# Patient Record
Sex: Female | Born: 1990 | Race: Black or African American | Hispanic: No | Marital: Single | Smoking: Current every day smoker
Health system: Southern US, Community
[De-identification: ages and names within clinical notes are randomized; demographics above are authoritative.]

## PROBLEM LIST (undated history)

## (undated) DIAGNOSIS — Z789 Other specified health status: Secondary | ICD-10-CM

## (undated) HISTORY — PX: NO PAST SURGERIES: SHX2092

---

## 2018-11-29 ENCOUNTER — Emergency Department (HOSPITAL_COMMUNITY)
Admission: EM | Admit: 2018-11-29 | Discharge: 2018-11-29 | Disposition: A | Discharge status: Home / Self Care | Payer: Self-pay | Attending: Emergency Medicine | Admitting: Emergency Medicine

## 2018-11-29 ENCOUNTER — Other Ambulatory Visit: Payer: Self-pay

## 2018-11-29 ENCOUNTER — Encounter (HOSPITAL_COMMUNITY): Payer: Self-pay

## 2018-11-29 DIAGNOSIS — N939 Abnormal uterine and vaginal bleeding, unspecified: Secondary | ICD-10-CM | POA: Insufficient documentation

## 2018-11-29 DIAGNOSIS — Z3202 Encounter for pregnancy test, result negative: Secondary | ICD-10-CM | POA: Insufficient documentation

## 2018-11-29 LAB — POC URINE PREG, ED: Preg Test, Ur: NEGATIVE

## 2018-11-29 NOTE — ED Triage Notes (Signed)
Patient missed two monthly periods, thought she may be pregnant and then started some vaginal bleeding today.  Patient's brother died two days ago

## 2018-11-29 NOTE — ED Notes (Signed)
Pt ambulated to the restroom with steady gait. 

## 2018-11-29 NOTE — ED Notes (Signed)
Patient Alert and oriented to baseline. Stable and ambulatory to baseline. Patient verbalized understanding of the discharge instructions.  Patient belongings were taken by the patient.   

## 2018-11-29 NOTE — Discharge Instructions (Addendum)
Recheck at [facility] if periods do not return to normal

## 2018-11-29 NOTE — ED Notes (Signed)
Urine culture collected and sent to the main lab. 

## 2018-11-29 NOTE — ED Provider Notes (Signed)
[facility] Provider Note   CSN: [id number] Arrival date & time: 11/29/18  1112     History   Chief Complaint Chief Complaint  Patient presents with  . Vaginal Bleeding    HPI Lynn Davis is a 28 y.o. female.     The history is provided by the patient. No language interpreter was used.  Vaginal Bleeding Quality:  Bright red Severity:  Moderate Onset quality:  Gradual Timing:  Constant Progression:  Worsening Chronicity:  New Possible pregnancy: yes   Relieved by:  None tried Worsened by:  Nothing Ineffective treatments:  None tried Associated symptoms: no abdominal pain   Risk factors: unprotected sex   Pt missed a period last month and she is concerned that she could be pregnant  History reviewed. No pertinent past medical history.  There are no active problems to display for this patient.   History reviewed. No pertinent surgical history.   OB History   No obstetric history on file.      Home Medications    Prior to Admission medications   Not on File    Family History History reviewed. No pertinent family history.  Social History Social History   Tobacco Use  . Smoking status: Never Smoker  Substance Use Topics  . Alcohol use: Yes    Alcohol/week: 7.0 standard drinks    Types: 7 Glasses of wine per week  . Drug use: Yes    Frequency: 3.0 times per week    Types: Marijuana     Allergies   Patient has no known allergies.   Review of Systems Review of Systems  Gastrointestinal: Negative for abdominal pain.  Genitourinary: Positive for vaginal bleeding.  All other systems reviewed and are negative.    Physical Exam Updated Vital Signs BP 117/88 (BP Location: Right Arm)   Pulse 78   Temp 98.3 F (36.8 C) (Oral)   Resp 17   Ht 5\' 2"  (1.575 m)   Wt 59 kg   LMP 09/30/2018 (Exact Date)   SpO2 100%   BMI 23.78 kg/m   Physical Exam Vitals signs and nursing note reviewed.   Constitutional:      Appearance: She is well-developed.  HENT:     Head: Normocephalic.  Neck:     Musculoskeletal: Normal range of motion.  Cardiovascular:     Rate and Rhythm: Normal rate.  Pulmonary:     Effort: Pulmonary effort is normal.  Abdominal:     General: Abdomen is flat. There is no distension.  Musculoskeletal: Normal range of motion.  Skin:    General: Skin is warm.  Neurological:     General: No focal deficit present.     Mental Status: She is alert and oriented to person, place, and time.  Psychiatric:        Mood and Affect: Mood normal.      ED Treatments / Results  Labs (all labs ordered are listed, but only abnormal results are displayed) Labs Reviewed  POC URINE PREG, ED    EKG None  Radiology No results found.  Procedures Procedures (including critical care time)  Medications Ordered in ED Medications - No data to display   Initial Impression / Assessment and Plan / ED Course  I have reviewed the triage vital signs and the nursing notes.  Pertinent labs & imaging results that were available during my care of the patient were reviewed by me and considered in my medical decision making (see  chart for details).        Pregancy test is negative.   Final Clinical Impressions(s) / ED Diagnoses   Final diagnoses:  Vaginal bleeding    ED Discharge Orders    None    An After Visit Summary was printed and given to the patient.    Sidney Ace 11/29/18 1246    Virgel Manifold, MD 12/02/18 1520

## 2019-12-22 ENCOUNTER — Inpatient Hospital Stay (HOSPITAL_COMMUNITY)
Admission: AD | Admit: 2019-12-22 | Discharge: 2019-12-22 | Disposition: A | Discharge status: Home / Self Care | Payer: Medicaid Other | Attending: Family Medicine | Admitting: Family Medicine

## 2019-12-22 ENCOUNTER — Other Ambulatory Visit: Payer: Self-pay

## 2019-12-22 ENCOUNTER — Inpatient Hospital Stay (HOSPITAL_COMMUNITY): Payer: Medicaid Other

## 2019-12-22 ENCOUNTER — Encounter (HOSPITAL_COMMUNITY): Payer: Self-pay | Admitting: Family Medicine

## 2019-12-22 DIAGNOSIS — O468X1 Other antepartum hemorrhage, first trimester: Secondary | ICD-10-CM | POA: Diagnosis not present

## 2019-12-22 DIAGNOSIS — N939 Abnormal uterine and vaginal bleeding, unspecified: Secondary | ICD-10-CM

## 2019-12-22 DIAGNOSIS — O26851 Spotting complicating pregnancy, first trimester: Secondary | ICD-10-CM | POA: Insufficient documentation

## 2019-12-22 DIAGNOSIS — Z6790 Unspecified blood type, Rh positive: Secondary | ICD-10-CM | POA: Insufficient documentation

## 2019-12-22 DIAGNOSIS — Z3A01 Less than 8 weeks gestation of pregnancy: Secondary | ICD-10-CM | POA: Diagnosis not present

## 2019-12-22 DIAGNOSIS — O418X10 Other specified disorders of amniotic fluid and membranes, first trimester, not applicable or unspecified: Secondary | ICD-10-CM | POA: Diagnosis not present

## 2019-12-22 DIAGNOSIS — O458X1 Other premature separation of placenta, first trimester: Secondary | ICD-10-CM

## 2019-12-22 DIAGNOSIS — O219 Vomiting of pregnancy, unspecified: Secondary | ICD-10-CM | POA: Insufficient documentation

## 2019-12-22 HISTORY — DX: Other specified health status: Z78.9

## 2019-12-22 LAB — COMPREHENSIVE METABOLIC PANEL
ALT: 15 U/L (ref 0–44)
AST: 12 U/L — ABNORMAL LOW (ref 15–41)
Albumin: 4 g/dL (ref 3.5–5.0)
Alkaline Phosphatase: 42 U/L (ref 38–126)
Anion gap: 10 (ref 5–15)
BUN: 5 mg/dL — ABNORMAL LOW (ref 6–20)
CO2: 22 mmol/L (ref 22–32)
Calcium: 9.2 mg/dL (ref 8.9–10.3)
Chloride: 104 mmol/L (ref 98–111)
Creatinine, Ser: 0.67 mg/dL (ref 0.44–1.00)
GFR calc Af Amer: >60 mL/min (ref >60)
GFR calc non Af Amer: >60 mL/min (ref >60)
Glucose, Bld: 84 mg/dL (ref 70–99)
Potassium: 3.6 mmol/L (ref 3.5–5.1)
Sodium: 136 mmol/L (ref 135–145)
Total Bilirubin: 0.7 mg/dL (ref 0.3–1.2)
Total Protein: 7.1 g/dL (ref 6.5–8.1)

## 2019-12-22 LAB — URINALYSIS, ROUTINE W REFLEX MICROSCOPIC
Bilirubin Urine: NEGATIVE
Glucose, UA: NEGATIVE mg/dL
Ketones, ur: NEGATIVE mg/dL
Leukocytes,Ua: NEGATIVE
Nitrite: NEGATIVE
Protein, ur: 30 mg/dL — AB
Specific Gravity, Urine: 1.028 (ref 1.005–1.030)
pH: 6 (ref 5.0–8.0)

## 2019-12-22 LAB — WET PREP, GENITAL
Sperm: NONE SEEN
Trich, Wet Prep: NONE SEEN
Yeast Wet Prep HPF POC: NONE SEEN

## 2019-12-22 LAB — CBC
HCT: 37.3 % (ref 36.0–46.0)
Hemoglobin: 12.4 g/dL (ref 12.0–15.0)
MCH: 28 pg (ref 26.0–34.0)
MCHC: 33.2 g/dL (ref 30.0–36.0)
MCV: 84.2 fL (ref 80.0–100.0)
Platelets: 248 10*3/uL (ref 150–400)
RBC: 4.43 MIL/uL (ref 3.87–5.11)
RDW: 13.2 % (ref 11.5–15.5)
WBC: 3.4 10*3/uL — ABNORMAL LOW (ref 4.0–10.5)
nRBC: 0 % (ref 0.0–0.2)

## 2019-12-22 LAB — ABO/RH: ABO/RH(D): O POS

## 2019-12-22 LAB — HCG, QUANTITATIVE, PREGNANCY: hCG, Beta Chain, Quant, S: 27693 m[IU]/mL — ABNORMAL HIGH (ref <5)

## 2019-12-22 NOTE — Discharge Instructions (Signed)
Safe Medications in Pregnancy    Acne: Benzoyl Peroxide Salicylic Acid  Backache/Headache: Tylenol: 2 regular strength every 4 hours OR              2 Extra strength every 6 hours  Colds/Coughs/Allergies: Benadryl (alcohol free) 25 mg every 6 hours as needed Breath right strips Claritin Cepacol throat lozenges Chloraseptic throat spray Cold-Eeze- up to three times per day Cough drops, alcohol free Flonase (by prescription only) Guaifenesin Mucinex Robitussin DM (plain only, alcohol free) Saline nasal spray/drops Sudafed (pseudoephedrine) & Actifed ** use only after [redacted] weeks gestation and if you do not have high blood pressure Tylenol Vicks Vaporub Zinc lozenges Zyrtec   Constipation: Colace Ducolax suppositories Fleet enema Glycerin suppositories Metamucil Milk of magnesia Miralax Senokot Smooth move tea  Diarrhea: Kaopectate Imodium A-D  *NO pepto Bismol  Hemorrhoids: Anusol Anusol HC Preparation H Tucks  Indigestion: Tums Maalox Mylanta Zantac  Pepcid  Insomnia: Benadryl (alcohol free) 25mg  every 6 hours as needed Tylenol PM Unisom, no Gelcaps  Leg Cramps: Tums MagGel  Nausea/Vomiting:  Bonine Dramamine Emetrol Ginger extract Sea bands Meclizine  Nausea medication to take during pregnancy:  Unisom (doxylamine succinate 25 mg tablets) Take one tablet daily at bedtime. If symptoms are not adequately controlled, the dose can be increased to a maximum recommended dose of two tablets daily (1/2 tablet in the morning, 1/2 tablet mid-afternoon and one at bedtime). Vitamin B6 100mg  tablets. Take one tablet twice a day (up to 200 mg per day).  Skin Rashes: Aveeno products Benadryl cream or 25mg  every 6 hours as needed Calamine Lotion 1% cortisone cream  Yeast infection: Gyne-lotrimin 7 Monistat 7   **If taking multiple medications, please check labels to avoid duplicating the same active ingredients **take  medication as directed on the label ** Do not exceed 4000 mg of tylenol in 24 hours **Do not take medications that contain aspirin or ibuprofen           Prenatal Care Providers           [facility] @ [facility] - accepts patients without insurance  Phone: [phone]  Center for @ [facility]   Phone: [phone]  [facility] @[place]       Phone: [phone]            [facility] for [facility] @ [place]     Phone: [phone]          Center for [id number] @ [facility]   Phone: [phone]  [facility] for [facility] @ [facility] - accepts patients without insurance  Phone: [phone]  [facility] for [facility] @ [facility] Phone: [phone]     [facility] Department - accepts patients without insurance Phone: [phone]  [facility]  Phone: [phone]  [facility] Phone: [phone]  [facility] Phone: [phone]  [facility] Phone: [phone]  [facility] Phone: [phone]  [facility]  Phone: [phone]         First Trimester of Pregnancy The first trimester of pregnancy is from week 1 until the end of week 13 (months 1 through 3). A week after a sperm fertilizes an egg, the egg will implant on the wall of the uterus. This embryo will begin to develop into a baby. Genes from you and your partner will form the baby. The female genes will determine whether the baby will be a boy or a girl. At  6-8 weeks, the eyes and face will be formed, and the heartbeat can be seen on ultrasound. At the end of 12 weeks, all the baby's organs will be formed. Now that you are pregnant, you will want to do everything you can to have a healthy baby. Two of the most important things are to get good prenatal care and to follow your health care provider's instructions. Prenatal care is all the  medical care you receive before the baby's birth. This care will help prevent, find, and treat any problems during the pregnancy and childbirth. Body changes during your first trimester Your body goes through many changes during pregnancy. The changes vary from woman to woman.  You may gain or lose a couple of pounds at first.  You may feel sick to your stomach (nauseous) and you may throw up (vomit). If the vomiting is uncontrollable, call your health care provider.  You may tire easily.  You may develop headaches that can be relieved by medicines. All medicines should be approved by your health care provider.  You may urinate more often. Painful urination may mean you have a bladder infection.  You may develop heartburn as a result of your pregnancy.  You may develop constipation because certain hormones are causing the muscles that push stool through your intestines to slow down.  You may develop hemorrhoids or swollen veins (varicose veins).  Your breasts may begin to grow larger and become tender. Your nipples may stick out more, and the tissue that surrounds them (areola) may become darker.  Your gums may bleed and may be sensitive to brushing and flossing.  Dark spots or blotches (chloasma, mask of pregnancy) may develop on your face. This will likely fade after the baby is born.  Your menstrual periods will stop.  You may have a loss of appetite.  You may develop cravings for certain kinds of food.  You may have changes in your emotions from day to day, such as being excited to be pregnant or being concerned that something may go wrong with the pregnancy and baby.  You may have more vivid and strange dreams.  You may have changes in your hair. These can include thickening of your hair, rapid growth, and changes in texture. Some women also have hair loss during or after pregnancy, or hair that feels dry or thin. Your hair will most likely return to normal after your baby is  born. What to expect at prenatal visits During a routine prenatal visit:  You will be weighed to make sure you and the baby are growing normally.  Your blood pressure will be taken.  Your abdomen will be measured to track your baby's growth.  The fetal heartbeat will be listened to between weeks 10 and 14 of your pregnancy.  Test results from any previous visits will be discussed. Your health care provider may ask you:  How you are feeling.  If you are feeling the baby move.  If you have had any abnormal symptoms, such as leaking fluid, bleeding, severe headaches, or abdominal cramping.  If you are using any tobacco products, including cigarettes, chewing tobacco, and electronic cigarettes.  If you have any questions. Other tests that may be performed during your first trimester include:  Blood tests to find your blood type and to check for the presence of any previous infections. The tests will also be used to check for low iron levels (anemia) and protein on red blood cells (Rh antibodies). Depending on your  risk factors, or if you previously had diabetes during pregnancy, you may have tests to check for high blood sugar that affects pregnant women (gestational diabetes).  Urine tests to check for infections, diabetes, or protein in the urine.  An ultrasound to confirm the proper growth and development of the baby.  Fetal screens for spinal cord problems (spina bifida) and Down syndrome.  HIV (human immunodeficiency virus) testing. Routine prenatal testing includes screening for HIV, unless you choose not to have this test.  You may need other tests to make sure you and the baby are doing well. Follow these instructions at home: Medicines  Follow your health care provider's instructions regarding medicine use. Specific medicines may be either safe or unsafe to take during pregnancy.  Take a prenatal vitamin that contains at least 600 micrograms (mcg) of folic acid.  If  you develop constipation, try taking a stool softener if your health care provider approves. Eating and drinking   Eat a balanced diet that includes fresh fruits and vegetables, whole grains, good sources of protein such as meat, eggs, or tofu, and low-fat dairy. Your health care provider will help you determine the amount of weight gain that is right for you.  Avoid raw meat and uncooked cheese. These carry germs that can cause birth defects in the baby.  Eating four or five small meals rather than three large meals a day may help relieve nausea and vomiting. If you start to feel nauseous, eating a few soda crackers can be helpful. Drinking liquids between meals, instead of during meals, also seems to help ease nausea and vomiting.  Limit foods that are high in fat and processed sugars, such as fried and sweet foods.  To prevent constipation: ? Eat foods that are high in fiber, such as fresh fruits and vegetables, whole grains, and beans. ? Drink enough fluid to keep your urine clear or pale yellow. Activity  Exercise only as directed by your health care provider. Most women can continue their usual exercise routine during pregnancy. Try to exercise for 30 minutes at least 5 days a week. Exercising will help you: ? Control your weight. ? Stay in shape. ? Be prepared for labor and delivery.  Experiencing pain or cramping in the lower abdomen or lower back is a good sign that you should stop exercising. Check with your health care provider before continuing with normal exercises.  Try to avoid standing for long periods of time. Move your legs often if you must stand in one place for a long time.  Avoid heavy lifting.  Wear low-heeled shoes and practice good posture.  You may continue to have sex unless your health care provider tells you not to. Relieving pain and discomfort  Wear a good support bra to relieve breast tenderness.  Take warm sitz baths to soothe any pain or discomfort  caused by hemorrhoids. Use hemorrhoid cream if your health care provider approves.  Rest with your legs elevated if you have leg cramps or low back pain.  If you develop varicose veins in your legs, wear support hose. Elevate your feet for 15 minutes, 3-4 times a day. Limit salt in your diet. Prenatal care  Schedule your prenatal visits by the twelfth week of pregnancy. They are usually scheduled monthly at first, then more often in the last 2 months before delivery.  Write down your questions. Take them to your prenatal visits.  Keep all your prenatal visits as told by your health care provider. This  is important. Safety  Wear your seat belt at all times when driving.  Make a list of emergency phone numbers, including numbers for family, friends, the hospital, and police and fire departments. General instructions  Ask your health care provider for a referral to a local prenatal education class. Begin classes no later than the beginning of month 6 of your pregnancy.  Ask for help if you have counseling or nutritional needs during pregnancy. Your health care provider can offer advice or refer you to specialists for help with various needs.  Do not use hot tubs, steam rooms, or saunas.  Do not douche or use tampons or scented sanitary pads.  Do not cross your legs for long periods of time.  Avoid cat litter boxes and soil used by cats. These carry germs that can cause birth defects in the baby and possibly loss of the fetus by miscarriage or stillbirth.  Avoid all smoking, herbs, alcohol, and medicines not prescribed by your health care provider. Chemicals in these products affect the formation and growth of the baby.  Do not use any products that contain nicotine or tobacco, such as cigarettes and e-cigarettes. If you need help quitting, ask your health care provider. You may receive counseling support and other resources to help you quit.  Schedule a dentist appointment. At home,  brush your teeth with a soft toothbrush and be gentle when you floss. Contact a health care provider if:  You have dizziness.  You have mild pelvic cramps, pelvic pressure, or nagging pain in the abdominal area.  You have persistent nausea, vomiting, or diarrhea.  You have a bad smelling vaginal discharge.  You have pain when you urinate.  You notice increased swelling in your face, hands, legs, or ankles.  You are exposed to fifth disease or chickenpox.  You are exposed to [place] measles (rubella) and have never had it. Get help right away if:  You have a fever.  You are leaking fluid from your vagina.  You have spotting or bleeding from your vagina.  You have severe abdominal cramping or pain.  You have rapid weight gain or loss.  You vomit blood or material that looks like coffee grounds.  You develop a severe headache.  You have shortness of breath.  You have any kind of trauma, such as from a fall or a car accident. Summary  The first trimester of pregnancy is from week 1 until the end of week 13 (months 1 through 3).  Your body goes through many changes during pregnancy. The changes vary from woman to woman.  You will have routine prenatal visits. During those visits, your health care provider will examine you, discuss any test results you may have, and talk with you about how you are feeling. This information is not intended to replace advice given to you by your health care provider. Make sure you discuss any questions you have with your health care provider. Document Revised: 04/16/2017 Document Reviewed: 04/15/2016 Elsevier Patient Education  2020 Elsevier Inc.        Vaginal Bleeding During Pregnancy, First Trimester  A small amount of bleeding from the vagina (spotting) is relatively common during early pregnancy. It usually stops on its own. Various things may cause bleeding or spotting during early pregnancy. Some bleeding may be related to the  pregnancy, and some may not. In many cases, the bleeding is normal and is not a problem. However, bleeding can also be a sign of something serious. Be sure  to tell your health care provider about any vaginal bleeding right away. Some possible causes of vaginal bleeding during the first trimester include:  Infection or inflammation of the cervix.  Growths (polyps) on the cervix.  Miscarriage or threatened miscarriage.  Pregnancy tissue developing outside of the uterus (ectopic pregnancy).  A mass of tissue developing in the uterus due to an egg being fertilized incorrectly (molar pregnancy). Follow these instructions at home: Activity  Follow instructions from your health care provider about limiting your activity. Ask what activities are safe for you.  If needed, make plans for someone to help with your regular activities.  Do not have sex or orgasms until your health care provider says that this is safe. General instructions  Take over-the-counter and prescription medicines only as told by your health care provider.  Pay attention to any changes in your symptoms.  Do not use tampons or douche.  Write down how many pads you use each day, how often you change pads, and how soaked (saturated) they are.  If you pass any tissue from your vagina, save the tissue so you can show it to your health care provider.  Keep all follow-up visits as told by your health care provider. This is important. Contact a health care provider if:  You have vaginal bleeding during any part of your pregnancy.  You have cramps or labor pains.  You have a fever. Get help right away if:  You have severe cramps in your back or abdomen.  You pass large clots or a large amount of tissue from your vagina.  Your bleeding increases.  You feel light-headed or weak, or you faint.  You have chills.  You are leaking fluid or have a gush of fluid from your vagina. Summary  A small amount of bleeding  (spotting) from the vagina is relatively common during early pregnancy.  Various things may cause bleeding or spotting in early pregnancy.  Be sure to tell your health care provider about any vaginal bleeding right away. This information is not intended to replace advice given to you by your health care provider. Make sure you discuss any questions you have with your health care provider. Document Revised: 08/23/2018 Document Reviewed: 08/06/2016 Elsevier Patient Education  2020 Elsevier Inc.        Subchorionic Hematoma  A subchorionic hematoma is a gathering of blood between the outer wall of the embryo (chorion) and the inner wall of the womb (uterus). This condition can cause vaginal bleeding. If they cause little or no vaginal bleeding, early small hematomas usually shrink on their own and do not affect your baby or pregnancy. When bleeding starts later in pregnancy, or if the hematoma is larger or occurs in older pregnant women, the condition may be more serious. Larger hematomas may get bigger, which increases the chances of miscarriage. This condition also increases the risk of:  Premature separation of the placenta from the uterus.  Premature (preterm) labor.  Stillbirth. What are the causes? The exact cause of this condition is not known. It occurs when blood is trapped between the placenta and the uterine wall because the placenta has separated from the original site of implantation. What increases the risk? You are more likely to develop this condition if:  You were treated with fertility medicines.  You conceived through in vitro fertilization (IVF). What are the signs or symptoms? Symptoms of this condition include:  Vaginal spotting or bleeding.  Contractions of the uterus. These cause abdominal pain.  Sometimes you may have no symptoms and the bleeding may only be seen when ultrasound images are taken (transvaginal ultrasound). How is this diagnosed? This  condition is diagnosed based on a physical exam. This includes a pelvic exam. You may also have other tests, including:  Blood tests.  Urine tests.  Ultrasound of the abdomen. How is this treated? Treatment for this condition can vary. Treatment may include:  Watchful waiting. You will be monitored closely for any changes in bleeding. During this stage: ? The hematoma may be reabsorbed by the body. ? The hematoma may separate the fluid-filled space containing the embryo (gestational sac) from the wall of the womb (endometrium).  Medicines.  Activity restriction. This may be needed until the bleeding stops. Follow these instructions at home:  Stay on bed rest if told to do so by your health care provider.  Do not lift anything that is heavier than 10 lbs. (4.5 kg) or as told by your health care provider.  Do not use any products that contain nicotine or tobacco, such as cigarettes and e-cigarettes. If you need help quitting, ask your health care provider.  Track and write down the number of pads you use each day and how soaked (saturated) they are.  Do not use tampons.  Keep all follow-up visits as told by your health care provider. This is important. Your health care provider may ask you to have follow-up blood tests or ultrasound tests or both. Contact a health care provider if:  You have any vaginal bleeding.  You have a fever. Get help right away if:  You have severe cramps in your stomach, back, abdomen, or pelvis.  You pass large clots or tissue. Save any tissue for your health care provider to look at.  You have more vaginal bleeding, and you faint or become lightheaded or weak. Summary  A subchorionic hematoma is a gathering of blood between the outer wall of the placenta and the uterus.  This condition can cause vaginal bleeding.  Sometimes you may have no symptoms and the bleeding may only be seen when ultrasound images are taken.  Treatment may include  watchful waiting, medicines, or activity restriction. This information is not intended to replace advice given to you by your health care provider. Make sure you discuss any questions you have with your health care provider. Document Revised: 04/16/2017 Document Reviewed: 06/30/2016 Elsevier Patient Education  2020 [employer].

## 2019-12-22 NOTE — Progress Notes (Signed)
GC/Chlamydia & wet prep cultures collected by this RN.

## 2019-12-22 NOTE — MAU Provider Note (Signed)
History     CSN: [id number]  Arrival date and time: 12/22/19 9924   First Provider Initiated Contact with Patient 12/22/19 1031      Chief Complaint  Patient presents with  . Vaginal Bleeding  . Emesis   [name] is a 29 y.o. G2P0010 at [redacted]w[redacted]d who presents to MAU for vaginal bleeding which began yesterday. Patient endorses bleeding when wiping only. Patient reports bleeding is light and pink.  Passing blood clots? no Blood soaking clothes? no Lightheaded/dizzy? no Significant pelvic pain or cramping? no Passed any tissue? no Hx ectopic pregnancy? no  Current pregnancy problems? Pt has not yet been seen Blood Type? unknown Allergies? NKDA Current medications? promethazine Current PNC & next appt? Pt requests list of OB providers  Pt denies vaginal discharge/odor/itching. Pt denies N/V, abdominal pain, constipation, diarrhea, or urinary problems. Pt denies fever, chills, fatigue, sweating or changes in appetite. Pt denies SOB or chest pain. Pt denies dizziness, HA, light-headedness, weakness.   OB History    Gravida  2   Para      Term      Preterm      AB  1   Living        SAB      TAB  1   Ectopic      Multiple      Live Births              Past Medical History:  Diagnosis Date  . Medical history non-contributory     Past Surgical History:  Procedure Laterality Date  . NO PAST SURGERIES      Family History  Problem Relation Age of Onset  . Hyperlipidemia Mother   . Emphysema Father     Social History   Tobacco Use  . Smoking status: Never Smoker  . Smokeless tobacco: Never Used  Vaping Use  . Vaping Use: Never used  Substance Use Topics  . Alcohol use: Yes    Comment: not often  . Drug use: Yes    Frequency: 7.0 times per week    Types: Marijuana    Allergies: No Known Allergies  Medications Prior to Admission  Medication Sig Dispense Refill Last Dose  . Multiple Vitamin (MULTIVITAMIN) tablet Take 1  tablet by mouth daily.   12/21/2019 at Unknown time  . promethazine (PHENERGAN) 25 MG tablet Take 25 mg by mouth every 6 (six) hours as needed for nausea or vomiting.   12/21/2019 at Unknown time    Review of Systems  Constitutional: Negative for chills, diaphoresis, fatigue and fever.  Eyes: Negative for visual disturbance.  Respiratory: Negative for shortness of breath.   Cardiovascular: Negative for chest pain.  Gastrointestinal: Negative for abdominal pain, constipation, diarrhea, nausea and vomiting.  Genitourinary: Positive for vaginal bleeding. Negative for dysuria, flank pain, frequency, pelvic pain, urgency and vaginal discharge.  Neurological: Negative for dizziness, weakness, light-headedness and headaches.   Physical Exam   Blood pressure 114/74, pulse 81, temperature 98.6 F (37 C), temperature source Oral, resp. rate 16, height 5\' 3"  (1.6 m), weight 65.3 kg, last menstrual period 10/21/2019, SpO2 100 %.  Patient Vitals for the past 24 hrs:  BP Temp Temp src Pulse Resp SpO2 Height Weight  12/22/19 1247 114/74 -- -- 81 -- -- -- --  12/22/19 0957 104/81 98.6 F (37 C) Oral 79 16 100 % 5\' 3"  (1.6 m) 65.3 kg   Physical Exam Vitals and nursing note reviewed.  Constitutional:  General: She is not in acute distress.    Appearance: Normal appearance. She is normal weight. She is not ill-appearing, toxic-appearing or diaphoretic.  HENT:     Head: Normocephalic and atraumatic.  Pulmonary:     Effort: Pulmonary effort is normal.  Neurological:     Mental Status: She is alert and oriented to person, place, and time.  Psychiatric:        Mood and Affect: Mood normal.        Behavior: Behavior normal.        Thought Content: Thought content normal.        Judgment: Judgment normal.    Results for orders placed or performed during the hospital encounter of 12/22/19 (from the past 24 hour(s))  Urinalysis, Routine w reflex microscopic Urine, Clean Catch     Status: Abnormal    Collection Time: 12/22/19 10:50 AM  Result Value Ref Range   Color, Urine AMBER (A) YELLOW   APPearance HAZY (A) CLEAR   Specific Gravity, Urine 1.028 1.005 - 1.030   pH 6.0 5.0 - 8.0   Glucose, UA NEGATIVE NEGATIVE mg/dL   Hgb urine dipstick MODERATE (A) NEGATIVE   Bilirubin Urine NEGATIVE NEGATIVE   Ketones, ur NEGATIVE NEGATIVE mg/dL   Protein, ur 30 (A) NEGATIVE mg/dL   Nitrite NEGATIVE NEGATIVE   Leukocytes,Ua NEGATIVE NEGATIVE   RBC / HPF 0-5 0 - 5 RBC/hpf   WBC, UA 0-5 0 - 5 WBC/hpf   Bacteria, UA FEW (A) NONE SEEN   Squamous Epithelial / LPF 11-20 0 - 5   Mucus PRESENT   Wet prep, genital     Status: Abnormal   Collection Time: 12/22/19 10:50 AM   Specimen: PATH Cytology Cervicovaginal Ancillary Only  Result Value Ref Range   Yeast Wet Prep HPF POC NONE SEEN NONE SEEN   Trich, Wet Prep NONE SEEN NONE SEEN   Clue Cells Wet Prep HPF POC PRESENT (A) NONE SEEN   WBC, Wet Prep HPF POC MODERATE (A) NONE SEEN   Sperm NONE SEEN   ABO/Rh     Status: None   Collection Time: 12/22/19 11:09 AM  Result Value Ref Range   ABO/RH(D) O POS    No rh immune globuloin      NOT A RH IMMUNE GLOBULIN CANDIDATE, PT RH POSITIVE Performed at [facility], [address] [place], [place] [address]   CBC     Status: Abnormal   Collection Time: 12/22/19 11:22 AM  Result Value Ref Range   WBC 3.4 (L) 4.0 - 10.5 K/uL   RBC 4.43 3.87 - 5.11 MIL/uL   Hemoglobin 12.4 12.0 - 15.0 g/dL   HCT 32.4 36 - 46 %   MCV 84.2 80.0 - 100.0 fL   MCH 28.0 26.0 - 34.0 pg   MCHC 33.2 30.0 - 36.0 g/dL   RDW 40.1 02.7 - 25.3 %   Platelets 248 150 - 400 K/uL   nRBC 0.0 0.0 - 0.2 %  Comprehensive metabolic panel     Status: Abnormal   Collection Time: 12/22/19 11:22 AM  Result Value Ref Range   Sodium 136 135 - 145 mmol/L   Potassium 3.6 3.5 - 5.1 mmol/L   Chloride 104 98 - 111 mmol/L   CO2 22 22 - 32 mmol/L   Glucose, Bld 84 70 - 99 mg/dL   BUN 5 (L) 6 - 20 mg/dL   Creatinine, Ser 6.64 0.44 - 1.00  mg/dL   Calcium 9.2  8.9 - 10.3 mg/dL   Total Protein 7.1 6.5 - 8.1 g/dL   Albumin 4.0 3.5 - 5.0 g/dL   AST 12 (L) 15 - 41 U/L   ALT 15 0 - 44 U/L   Alkaline Phosphatase 42 38 - 126 U/L   Total Bilirubin 0.7 0.3 - 1.2 mg/dL   GFR calc non Af Amer >60 >60 mL/min   GFR calc Af Amer >60 >60 mL/min   Anion gap 10 5 - 15  hCG, quantitative, pregnancy     Status: Abnormal   Collection Time: 12/22/19 11:22 AM  Result Value Ref Range   hCG, Beta Chain, Quant, S 27,693 (H) <5 mIU/mL    US OB LESS THAN 14 WEEKS WITH OB TRANSVAGINAL  Result Date: 12/22/2019 CLINICAL DATA:  Vaginal bleeding without significant pelvic pain EXAM: OBSTETRIC <14 WK US AND TRANSVAGINAL OB US TECHNIQUE: Both transabdominal and transvaginal ultrasound examinations were performed for complete evaluation of the gestation as well as the maternal uterus, adnexal regions, and pelvic cul-de-sac. Transvaginal technique was performed to assess early pregnancy. COMPARISON:  None. FINDINGS: Intrauterine gestational sac: Present Yolk sac:  Present Embryo:  Present Cardiac Activity: Present Heart Rate: 124 bpm CRL:  2.1 mm   5 w   5 d                  US EDC: 08/18/2020 Subchorionic hemorrhage:  Small Maternal uterus/adnexae: Cystic changes are noted in the right ovary. IMPRESSION: Single live intrauterine gestation at 5 weeks 5 days. Small subchorionic hemorrhage is noted. Electronically Signed   By: Alcide CleverMark  Lukens M.D.   On: 12/22/2019 12:13    MAU Course  Procedures  MDM -r/o ectopic -UA: amber/hazy/mod hgb/30PRO/few bacteria, urine sent for culture -CBC: WBCs 3.4 -CMP: no abnormalities requiring treatment -US: single IUP, FHR 124, 6032w5d, sm SCH -hCG: 16,10927,693 -ABO: O Positive -WetPrep: +ClueCells (isolated finding not requiring treatment) -GC/CT collected -pt discharged to home in stable condition  Orders Placed This Encounter  Procedures  . Wet prep, genital    Standing Status:   Standing    Number of Occurrences:   1  .  Culture, OB Urine    Standing Status:   Standing    Number of Occurrences:   1  . US OB LESS THAN 14 WEEKS WITH OB TRANSVAGINAL    Standing Status:   Standing    Number of Occurrences:   1    Order Specific Question:   Symptom/Reason for Exam    Answer:   Vaginal spotting [[id number]]  . Urinalysis, Routine w reflex microscopic Urine, Clean Catch    Standing Status:   Standing    Number of Occurrences:   1  . CBC    Standing Status:   Standing    Number of Occurrences:   1  . Comprehensive metabolic panel    Standing Status:   Standing    Number of Occurrences:   1  . hCG, quantitative, pregnancy    Standing Status:   Standing    Number of Occurrences:   1  . ABO/Rh    Standing Status:   Standing    Number of Occurrences:   1  . Discharge patient    Order Specific Question:   Discharge disposition    Answer:   01-Home or Self Care [1]    Order Specific Question:   Discharge patient date    Answer:   12/22/2019   No orders of the defined types were  placed in this encounter.   Assessment and Plan   1. Subchorionic hemorrhage of placenta in first trimester, single or unspecified fetus   2. Vaginal spotting   3. Blood type, Rh positive   4. [redacted] weeks gestation of pregnancy     Allergies as of 12/22/2019   No Known Allergies     Medication List    TAKE these medications   multivitamin tablet Take 1 tablet by mouth daily.   promethazine 25 MG tablet Commonly known as: PHENERGAN Take 25 mg by mouth every 6 (six) hours as needed for nausea or vomiting.       -will call with culture results, if positive -safe meds in pregnancy list given -list of OB providers given -discussed expected s/sx of [facility] -return MAU precautions given -pt discharged to home in stable condition  Joni Reining E Tiah Heckel 12/22/2019, 12:54 PM

## 2019-12-22 NOTE — MAU Note (Signed)
+  preg test at [facility] (has paperwork with her) . Started bleeding yesterday, has started bleeding a little more, felt she needed to get checked out. Denies pain.

## 2019-12-23 LAB — CULTURE, OB URINE: Culture: 50000 — AB

## 2019-12-24 ENCOUNTER — Other Ambulatory Visit: Payer: Self-pay | Admitting: Advanced Practice Midwife

## 2019-12-24 ENCOUNTER — Encounter: Payer: Self-pay | Admitting: Advanced Practice Midwife

## 2019-12-24 DIAGNOSIS — R8271 Bacteriuria: Secondary | ICD-10-CM | POA: Insufficient documentation

## 2019-12-24 LAB — GC/CHLAMYDIA PROBE AMP (~~LOC~~) NOT AT ARMC
Chlamydia: NEGATIVE
Comment: NEGATIVE
Comment: NORMAL
Neisseria Gonorrhea: NEGATIVE

## 2019-12-24 MED ORDER — CEFADROXIL 500 MG PO CAPS: 500.0000 mg | ORAL_CAPSULE | Freq: Two times a day (BID) | ORAL | 0 refills | Status: DC

## 2019-12-24 NOTE — Progress Notes (Signed)
+   GBS bacteruria. Patient notified by phone. Correct pharmacy confirmed during phone call. Problem list updated.  Clayton Bibles, MSN, CNM Certified Nurse Midwife, [facility] for [employer], [facility] 12/24/19 9:50 AM

## 2019-12-26 ENCOUNTER — Other Ambulatory Visit: Payer: Self-pay

## 2019-12-26 DIAGNOSIS — Z3A01 Less than 8 weeks gestation of pregnancy: Secondary | ICD-10-CM | POA: Diagnosis not present

## 2019-12-26 DIAGNOSIS — O219 Vomiting of pregnancy, unspecified: Secondary | ICD-10-CM | POA: Diagnosis not present

## 2019-12-26 DIAGNOSIS — O2691 Pregnancy related conditions, unspecified, first trimester: Secondary | ICD-10-CM | POA: Diagnosis present

## 2019-12-26 DIAGNOSIS — Z20822 Contact with and (suspected) exposure to covid-19: Secondary | ICD-10-CM | POA: Diagnosis not present

## 2019-12-27 ENCOUNTER — Encounter (HOSPITAL_BASED_OUTPATIENT_CLINIC_OR_DEPARTMENT_OTHER): Payer: Self-pay | Admitting: *Deleted

## 2019-12-27 ENCOUNTER — Other Ambulatory Visit: Payer: Self-pay

## 2019-12-27 ENCOUNTER — Emergency Department (HOSPITAL_BASED_OUTPATIENT_CLINIC_OR_DEPARTMENT_OTHER)
Admission: EM | Admit: 2019-12-27 | Discharge: 2019-12-27 | Disposition: A | Discharge status: Home / Self Care | Payer: Medicaid Other | Attending: Emergency Medicine | Admitting: Emergency Medicine

## 2019-12-27 DIAGNOSIS — R112 Nausea with vomiting, unspecified: Secondary | ICD-10-CM

## 2019-12-27 LAB — SARS CORONAVIRUS 2 (TAT 6-24 HRS): SARS Coronavirus 2: NEGATIVE

## 2019-12-27 MED ORDER — LIDOCAINE VISCOUS HCL 2 % MT SOLN
15.0000 mL | Freq: Once | OROMUCOSAL | Status: AC
  Administered 2019-12-27: 15 mL via ORAL
  Filled 2019-12-27: qty 15

## 2019-12-27 MED ORDER — ALUM & MAG HYDROXIDE-SIMETH 400-400-40 MG/5ML PO SUSP: 10.0000 mL | Freq: Four times a day (QID) | ORAL | 0 refills | Status: DC | PRN

## 2019-12-27 MED ORDER — ALUM & MAG HYDROXIDE-SIMETH 200-200-20 MG/5ML PO SUSP
30.0000 mL | Freq: Once | ORAL | Status: AC
  Administered 2019-12-27: 30 mL via ORAL
  Filled 2019-12-27: qty 30

## 2019-12-27 MED ORDER — ONDANSETRON 4 MG PO TBDP: 4.0000 mg | ORAL_TABLET | Freq: Three times a day (TID) | ORAL | 0 refills | Status: AC | PRN

## 2019-12-27 MED ORDER — ONDANSETRON 4 MG PO TBDP
4.0000 mg | ORAL_TABLET | Freq: Once | ORAL | Status: AC
  Administered 2019-12-27: 4 mg via ORAL
  Filled 2019-12-27: qty 1

## 2019-12-27 NOTE — ED Notes (Signed)
Dr. Eudelia Bunch to triage to assess pt

## 2019-12-27 NOTE — ED Provider Notes (Signed)
[facility] EMERGENCY DEPARTMENT Provider Note  CSN: [id number] Arrival date & time: 12/26/19 2337  Chief Complaint(s) Abdominal Pain  HPI Lynn Davis is a 29 y.o. female who reports being [redacted] weeks pregnant presents to the emergency department with epigastric burning pain and several bouts of nonbloody nonbilious emesis.  Pain radiates up to the chest.  No shortness of breath.  No fevers or chills.  No cough or congestion.  No diarrhea.  HPI  Past Medical History Past Medical History:  Diagnosis Date  . Medical history non-contributory    Patient Active Problem List   Diagnosis Date Noted  . GBS bacteriuria 12/24/2019   Home Medication(s) Prior to Admission medications   Medication Sig Start Date End Date Taking? Authorizing Provider  alum & mag hydroxide-simeth (MAALOX ADVANCED MAX ST) 400-400-40 MG/5ML suspension Take 10 mLs by mouth every 6 (six) hours as needed for indigestion. 12/27/19   Jadia Capers, Amadeo Garnet, MD  cefadroxil (DURICEF) 500 MG capsule Take 1 capsule (500 mg total) by mouth 2 (two) times daily. 12/24/19   Calvert Cantor, CNM  Multiple Vitamin (MULTIVITAMIN) tablet Take 1 tablet by mouth daily.    [provider]  ondansetron (ZOFRAN ODT) 4 MG disintegrating tablet Take 1 tablet (4 mg total) by mouth every 8 (eight) hours as needed for up to 3 days for nausea or vomiting. 12/27/19 12/30/19  Elvin Banker, Amadeo Garnet, MD  promethazine (PHENERGAN) 25 MG tablet Take 25 mg by mouth every 6 (six) hours as needed for nausea or vomiting.    [provider]                                                                                                                                    Past Surgical History Past Surgical History:  Procedure Laterality Date  . NO PAST SURGERIES     Family History Family History  Problem Relation Age of Onset  . Hyperlipidemia Mother   . Emphysema Father     Social History Social History    Tobacco Use  . Smoking status: Never Smoker  . Smokeless tobacco: Never Used  Vaping Use  . Vaping Use: Never used  Substance Use Topics  . Alcohol use: Not Currently    Comment: not often  . Drug use: Not Currently    Frequency: 7.0 times per week    Types: Marijuana   Allergies Patient has no known allergies.  Review of Systems Review of Systems All other systems are reviewed and are negative for acute change except as noted in the HPI  Physical Exam Vital Signs  I have reviewed the triage vital signs BP 117/88 (BP Location: Right Arm)   Pulse (!) 56   Temp 99.4 F (37.4 C) (Oral)   Resp 14   Ht 5\' 3"  (1.6 m)   Wt 65.3 kg   LMP 10/21/2019 Comment: just spotting (neg UPT)  SpO2 99%  BMI 25.51 kg/m   Physical Exam Vitals reviewed.  Constitutional:      General: She is not in acute distress.    Appearance: She is well-developed. She is not diaphoretic.  HENT:     Head: Normocephalic and atraumatic.     Right Ear: External ear normal.     Left Ear: External ear normal.     Nose: Nose normal.  Eyes:     General: No scleral icterus.       Right eye: No discharge.        Left eye: No discharge.     Conjunctiva/sclera: Conjunctivae normal.     Pupils: Pupils are equal, round, and reactive to light.  Neck:     Trachea: Phonation normal.  Cardiovascular:     Rate and Rhythm: Normal rate and regular rhythm.     Heart sounds: No murmur heard.  No friction rub. No gallop.   Pulmonary:     Effort: Pulmonary effort is normal. No respiratory distress.     Breath sounds: Normal breath sounds. No stridor. No rales.  Abdominal:     General: There is no distension.     Palpations: Abdomen is soft.     Tenderness: There is abdominal tenderness in the epigastric area.  Musculoskeletal:        General: No tenderness. Normal range of motion.     Cervical back: Normal range of motion and neck supple.  Skin:    General: Skin is warm and dry.     Findings: No erythema  or rash.  Neurological:     Mental Status: She is alert and oriented to person, place, and time.  Psychiatric:        Behavior: Behavior normal.     ED Results and Treatments Labs (all labs ordered are listed, but only abnormal results are displayed) Labs Reviewed  SARS CORONAVIRUS 2 (TAT 6-24 HRS)                                                                                                                         EKG  EKG Interpretation  Date/Time:  Wednesday December 27 2019 00:10:29 EDT Ventricular Rate:  91 PR Interval:  150 QRS Duration: 86 QT Interval:  358 QTC Calculation: 440 R Axis:   93 Text Interpretation: Normal sinus rhythm Rightward axis Borderline ECG No old tracing to compare Confirmed by Drema Pryardama, Rayne Cowdrey [id number]) on 12/27/2019 12:48:28 AM      Radiology No results found.  Pertinent labs & imaging results that were available during my care of the patient were reviewed by me and considered in my medical decision making (see chart for details).  Medications Ordered in ED Medications  alum & mag hydroxide-simeth (MAALOX/MYLANTA) 200-200-20 MG/5ML suspension 30 mL (30 mLs Oral Given 12/27/19 0053)    And  lidocaine (XYLOCAINE) 2 % viscous mouth solution 15 mL (15 mLs Oral Given 12/27/19 0053)  ondansetron (ZOFRAN-ODT) disintegrating tablet 4 mg (4 mg Oral Given 12/27/19  1610)                                                                                                                                    Procedures Procedures  (including critical care time)  Medical Decision Making / ED Course I have reviewed the nursing notes for this encounter and the patient's prior records (if available in EHR or on provided paperwork).   Lynn Davis was evaluated in Emergency Department on 12/27/2019 for the symptoms described in the history of present illness. She was evaluated in the context of the global COVID-19 pandemic, which necessitated consideration that  the patient might be at risk for infection with the SARS-CoV-2 virus that causes COVID-19. Institutional protocols and algorithms that pertain to the evaluation of patients at risk for COVID-19 are in a state of rapid change based on information released by regulatory bodies including the CDC and federal and state organizations. These policies and algorithms were followed during the patient's care in the ED.  Confirmed pregnancy on 8 6 noted in records. Here for epigastric discomfort with nausea and nonbloody nonbilious emesis Patient with mild discomfort to palpation.  This was right after about of dry heaving.  Patient given ODT Zofran and GI cocktail which provided significant relief.  On reassessment, patient was abdomen was benign.  Believe this is due to hyperemesis gravidarum.  Given the lack of tenderness on reassessment, I have low suspicion for serious intra-abdominal inflammatory/infectious process such as acute cholecystitis or pancreatitis.  Doubt bowel obstruction..  Patient was able to tolerate oral intake.       Final Clinical Impression(s) / ED Diagnoses Final diagnoses:  Nausea and vomiting in adult   The patient appears reasonably screened and/or stabilized for discharge and I doubt any other medical condition or other [facility] requiring further screening, evaluation, or treatment in the ED at this time prior to discharge. Safe for discharge with strict return precautions.  Disposition: Discharge  Condition: Good  I have discussed the results, Dx and Tx plan with the patient/family who expressed understanding and agree(s) with the plan. Discharge instructions discussed at length. The patient/family was given strict return precautions who verbalized understanding of the instructions. No further questions at time of discharge.    ED Discharge Orders         Ordered    alum & mag hydroxide-simeth (MAALOX ADVANCED MAX ST) 400-400-40 MG/5ML suspension  Every 6 hours PRN      Discontinue  Reprint     12/27/19 0550    ondansetron (ZOFRAN ODT) 4 MG disintegrating tablet  Every 8 hours PRN     Discontinue  Reprint     12/27/19 0550            Follow Up: [facility] at [facility] [address] [place] [address] [phone] Call  to schedule follow up in 1-2 weeks  Primary care provider  Schedule an appointment as soon as possible for a visit  If you do not have a primary care physician, contact [facility] at [phone] for referral      This chart was dictated using voice recognition software.  Despite best efforts to proofread,  errors can occur which can change the documentation meaning.   Nira Conn, MD 12/27/19 [phone]

## 2019-12-27 NOTE — ED Triage Notes (Addendum)
Pt reports she thought she had food poisoning on 8/5 and went to the doctor and found out she was pregnant. Pt report at least 5 episodes of emesis today. She has taken phenergan without relief. C/o of mid chest pain, sharp and burning

## 2020-01-02 LAB — OB RESULTS CONSOLE HGB/HCT, BLOOD
HCT: 42 — AB (ref 29–41)
Hemoglobin: 14.1

## 2020-01-02 LAB — OB RESULTS CONSOLE ANTIBODY SCREEN: Antibody Screen: NEGATIVE

## 2020-01-02 LAB — OB RESULTS CONSOLE PLATELET COUNT: Platelets: 257

## 2020-01-11 LAB — OB RESULTS CONSOLE GC/CHLAMYDIA
Chlamydia: NEGATIVE
Gonorrhea: NEGATIVE

## 2020-01-14 LAB — OB RESULTS CONSOLE HGB/HCT, BLOOD
HCT: 38 (ref 29–41)
Hemoglobin: 12.9

## 2020-01-14 LAB — TSH+FREE T4
Free T4: 1.89
TSH: 0.035

## 2020-01-14 LAB — OB RESULTS CONSOLE PLATELET COUNT: Platelets: 287

## 2020-01-16 ENCOUNTER — Telehealth (INDEPENDENT_AMBULATORY_CARE_PROVIDER_SITE_OTHER): Payer: Self-pay | Admitting: *Deleted

## 2020-01-16 ENCOUNTER — Other Ambulatory Visit: Payer: Self-pay

## 2020-01-16 DIAGNOSIS — Z3490 Encounter for supervision of normal pregnancy, unspecified, unspecified trimester: Secondary | ICD-10-CM

## 2020-01-16 NOTE — Progress Notes (Signed)
[id number] not connected virtually. Per chart review I see that she has had an ob visit in [place] 01/10/20 and ED visit in [place]. I called [facility] and explained I was calling about her visit. She stated she isn't able to make the appointment. We discussed she went back home to [place] for a while because she has no help in [place] and went home for family to help. She confirms she went to ob in [place] and plans to come back to [facility] and transfer her care probably in September. I gave her our phone number and instructed her to call to reschedule appointment and to have her provider there transfer her care to [place] and send records. She voices understanding. Mylah Baynes,RN

## 2020-01-23 ENCOUNTER — Encounter: Payer: Self-pay | Admitting: Advanced Practice Midwife

## 2020-02-07 LAB — OB RESULTS CONSOLE ANTIBODY SCREEN: Antibody Screen: NEGATIVE

## 2020-02-09 ENCOUNTER — Encounter (HOSPITAL_COMMUNITY): Payer: Self-pay | Admitting: Obstetrics and Gynecology

## 2020-02-09 ENCOUNTER — Inpatient Hospital Stay (HOSPITAL_COMMUNITY)
Admission: AD | Admit: 2020-02-09 | Discharge: 2020-02-10 | Disposition: A | Discharge status: Home / Self Care | Payer: Medicaid Other | Attending: Obstetrics and Gynecology | Admitting: Obstetrics and Gynecology

## 2020-02-09 DIAGNOSIS — Z3A12 12 weeks gestation of pregnancy: Secondary | ICD-10-CM | POA: Insufficient documentation

## 2020-02-09 DIAGNOSIS — R110 Nausea: Secondary | ICD-10-CM | POA: Diagnosis not present

## 2020-02-09 DIAGNOSIS — O219 Vomiting of pregnancy, unspecified: Secondary | ICD-10-CM | POA: Diagnosis present

## 2020-02-09 DIAGNOSIS — O209 Hemorrhage in early pregnancy, unspecified: Secondary | ICD-10-CM | POA: Diagnosis not present

## 2020-02-09 DIAGNOSIS — O26891 Other specified pregnancy related conditions, first trimester: Secondary | ICD-10-CM | POA: Diagnosis not present

## 2020-02-09 DIAGNOSIS — Z79899 Other long term (current) drug therapy: Secondary | ICD-10-CM | POA: Insufficient documentation

## 2020-02-09 DIAGNOSIS — Z3A14 14 weeks gestation of pregnancy: Secondary | ICD-10-CM | POA: Diagnosis not present

## 2020-02-09 LAB — COMPREHENSIVE METABOLIC PANEL
ALT: 30 U/L (ref 0–44)
AST: 29 U/L (ref 15–41)
Albumin: 3.9 g/dL (ref 3.5–5.0)
Alkaline Phosphatase: 40 U/L (ref 38–126)
Anion gap: 17 — ABNORMAL HIGH (ref 5–15)
BUN: 5 mg/dL — ABNORMAL LOW (ref 6–20)
CO2: 18 mmol/L — ABNORMAL LOW (ref 22–32)
Calcium: 9.5 mg/dL (ref 8.9–10.3)
Chloride: 96 mmol/L — ABNORMAL LOW (ref 98–111)
Creatinine, Ser: 0.54 mg/dL (ref 0.44–1.00)
GFR calc Af Amer: >60 mL/min (ref >60)
GFR calc non Af Amer: >60 mL/min (ref >60)
Glucose, Bld: 78 mg/dL (ref 70–99)
Potassium: 2.7 mmol/L — CL (ref 3.5–5.1)
Sodium: 131 mmol/L — ABNORMAL LOW (ref 135–145)
Total Bilirubin: 0.9 mg/dL (ref 0.3–1.2)
Total Protein: 7.2 g/dL (ref 6.5–8.1)

## 2020-02-09 LAB — CBC WITH DIFFERENTIAL/PLATELET
Abs Immature Granulocytes: 0.01 10*3/uL (ref 0.00–0.07)
Basophils Absolute: 0 10*3/uL (ref 0.0–0.1)
Basophils Relative: 1 %
Eosinophils Absolute: 0 10*3/uL (ref 0.0–0.5)
Eosinophils Relative: 1 %
HCT: 35 % — ABNORMAL LOW (ref 36.0–46.0)
Hemoglobin: 12.4 g/dL (ref 12.0–15.0)
Immature Granulocytes: 0 %
Lymphocytes Relative: 30 %
Lymphs Abs: 1 10*3/uL (ref 0.7–4.0)
MCH: 29.2 pg (ref 26.0–34.0)
MCHC: 35.4 g/dL (ref 30.0–36.0)
MCV: 82.4 fL (ref 80.0–100.0)
Monocytes Absolute: 0.3 10*3/uL (ref 0.1–1.0)
Monocytes Relative: 8 %
Neutro Abs: 2 10*3/uL (ref 1.7–7.7)
Neutrophils Relative %: 60 %
Platelets: 232 10*3/uL (ref 150–400)
RBC: 4.25 MIL/uL (ref 3.87–5.11)
RDW: 12.3 % (ref 11.5–15.5)
WBC: 3.4 10*3/uL — ABNORMAL LOW (ref 4.0–10.5)
nRBC: 0 % (ref 0.0–0.2)

## 2020-02-09 LAB — URINALYSIS, ROUTINE W REFLEX MICROSCOPIC
Bacteria, UA: NONE SEEN
Bilirubin Urine: NEGATIVE
Glucose, UA: NEGATIVE mg/dL
Ketones, ur: 80 mg/dL — AB
Leukocytes,Ua: NEGATIVE
Nitrite: NEGATIVE
Protein, ur: 30 mg/dL — AB
Specific Gravity, Urine: 1.019 (ref 1.005–1.030)
pH: 6 (ref 5.0–8.0)

## 2020-02-09 LAB — T4, FREE: Free T4: 1.24 ng/dL — ABNORMAL HIGH (ref 0.61–1.12)

## 2020-02-09 LAB — TSH: TSH: 0.028 u[IU]/mL — ABNORMAL LOW (ref 0.350–4.500)

## 2020-02-09 MED ORDER — SODIUM CHLORIDE 0.9 % IV SOLN
8.0000 mg | Freq: Once | INTRAVENOUS | Status: AC
  Administered 2020-02-09: 8 mg via INTRAVENOUS
  Filled 2020-02-09: qty 4

## 2020-02-09 MED ORDER — LACTATED RINGERS IV BOLUS
1000.0000 mL | Freq: Once | INTRAVENOUS | Status: AC
  Administered 2020-02-09: 1000 mL via INTRAVENOUS

## 2020-02-09 MED ORDER — GLYCOPYRROLATE 0.2 MG/ML IJ SOLN
0.1000 mg | Freq: Once | INTRAMUSCULAR | Status: AC
  Administered 2020-02-09: 0.1 mg via INTRAVENOUS
  Filled 2020-02-09: qty 1

## 2020-02-09 MED ORDER — PANTOPRAZOLE SODIUM 40 MG PO TBEC
40.0000 mg | DELAYED_RELEASE_TABLET | Freq: Once | ORAL | Status: AC
  Administered 2020-02-09: 40 mg via ORAL
  Filled 2020-02-09: qty 1

## 2020-02-09 MED ORDER — ENSURE ENLIVE PO LIQD
1.0000 | Freq: Once | ORAL | Status: AC
  Administered 2020-02-09: 237 mL via ORAL
  Filled 2020-02-09: qty 237

## 2020-02-09 MED ORDER — PROMETHAZINE HCL 25 MG/ML IJ SOLN
12.5000 mg | Freq: Once | INTRAMUSCULAR | Status: AC
  Administered 2020-02-09: 12.5 mg via INTRAVENOUS
  Filled 2020-02-09: qty 1

## 2020-02-09 MED ORDER — POTASSIUM CHLORIDE CRYS ER 10 MEQ PO TBCR
10.0000 meq | EXTENDED_RELEASE_TABLET | Freq: Once | ORAL | Status: AC
  Administered 2020-02-09: 10 meq via ORAL
  Filled 2020-02-09: qty 1

## 2020-02-09 MED ORDER — FAMOTIDINE IN NACL 20-0.9 MG/50ML-% IV SOLN
20.0000 mg | Freq: Once | INTRAVENOUS | Status: AC
  Administered 2020-02-09: 20 mg via INTRAVENOUS
  Filled 2020-02-09: qty 50

## 2020-02-09 MED ORDER — POTASSIUM CHLORIDE 2 MEQ/ML IV SOLN
Freq: Once | INTRAVENOUS | Status: AC
  Filled 2020-02-09: qty 1000

## 2020-02-09 MED ORDER — ALUM & MAG HYDROXIDE-SIMETH 200-200-20 MG/5ML PO SUSP
15.0000 mL | Freq: Once | ORAL | Status: AC
  Administered 2020-02-09: 15 mL via ORAL
  Filled 2020-02-09 (×2): qty 30

## 2020-02-09 NOTE — MAU Provider Note (Addendum)
History     CSN: [id number]  Arrival date and time: 02/09/20 1217   First Provider Initiated Contact with Patient 02/09/20 1708      Chief Complaint  Patient presents with  . Vaginal Bleeding  . Emesis   HPI   Ms.Lynn Davis is a 29 y.o. female G2P0010 @ [redacted]w[redacted]d here in MAU with nausea and vomiting. Reports she was getting prenatal care in [place] and recently moved here. She has not established care with anyone. She reports not being able to keep anything down all week. She was taking zofran which was helping tremendously, however she ran out of the medication. She has not taken any nausea medication this week. She has not been able to keep water down.  Patient reports a 20 lb weight loss.   OB History    Gravida  2   Para      Term      Preterm      AB  1   Living        SAB      TAB  1   Ectopic      Multiple      Live Births              Past Medical History:  Diagnosis Date  . Medical history non-contributory     Past Surgical History:  Procedure Laterality Date  . NO PAST SURGERIES      Family History  Problem Relation Age of Onset  . Hyperlipidemia Mother   . Emphysema Father     Social History   Tobacco Use  . Smoking status: Never Smoker  . Smokeless tobacco: Never Used  Vaping Use  . Vaping Use: Never used  Substance Use Topics  . Alcohol use: Not Currently    Comment: not often  . Drug use: Not Currently    Frequency: 7.0 times per week    Types: Marijuana    Allergies: No Known Allergies  Medications Prior to Admission  Medication Sig Dispense Refill Last Dose  . Multiple Vitamin (MULTIVITAMIN) tablet Take 1 tablet by mouth daily.   Past Week at Unknown time  . alum & mag hydroxide-simeth (MAALOX ADVANCED MAX ST) 400-400-40 MG/5ML suspension Take 10 mLs by mouth every 6 (six) hours as needed for indigestion. 355 mL 0 Unknown at Unknown time  . cefadroxil (DURICEF) 500 MG capsule Take 1 capsule (500 mg total)  by mouth 2 (two) times daily. 14 capsule 0 Unknown at Unknown time  . promethazine (PHENERGAN) 25 MG tablet Take 25 mg by mouth every 6 (six) hours as needed for nausea or vomiting.   Unknown at Unknown time   Results for orders placed or performed during the hospital encounter of 02/09/20 (from the past 48 hour(s))  Urinalysis, Routine w reflex microscopic Urine, Clean Catch     Status: Abnormal   Collection Time: 02/09/20  3:23 PM  Result Value Ref Range   Color, Urine YELLOW YELLOW   APPearance HAZY (A) CLEAR   Specific Gravity, Urine 1.019 1.005 - 1.030   pH 6.0 5.0 - 8.0   Glucose, UA NEGATIVE NEGATIVE mg/dL   Hgb urine dipstick MODERATE (A) NEGATIVE   Bilirubin Urine NEGATIVE NEGATIVE   Ketones, ur 80 (A) NEGATIVE mg/dL   Protein, ur 30 (A) NEGATIVE mg/dL   Nitrite NEGATIVE NEGATIVE   Leukocytes,Ua NEGATIVE NEGATIVE   RBC / HPF 0-5 0 - 5 RBC/hpf   WBC, UA 0-5 0 - 5 WBC/hpf  Bacteria, UA NONE SEEN NONE SEEN   Squamous Epithelial / LPF 6-10 0 - 5   Mucus PRESENT     Comment: Performed at [facility], [address] [place], [place] [address]  CBC with Differential/Platelet     Status: Abnormal   Collection Time: 02/09/20  5:32 PM  Result Value Ref Range   WBC 3.4 (L) 4.0 - 10.5 K/uL   RBC 4.25 3.87 - 5.11 MIL/uL   Hemoglobin 12.4 12.0 - 15.0 g/dL   HCT 95.1 (L) 36 - 46 %   MCV 82.4 80.0 - 100.0 fL   MCH 29.2 26.0 - 34.0 pg   MCHC 35.4 30.0 - 36.0 g/dL   RDW 88.4 16.6 - 06.3 %   Platelets 232 150 - 400 K/uL   nRBC 0.0 0.0 - 0.2 %   Neutrophils Relative % 60 %   Neutro Abs 2.0 1.7 - 7.7 K/uL   Lymphocytes Relative 30 %   Lymphs Abs 1.0 0.7 - 4.0 K/uL   Monocytes Relative 8 %   Monocytes Absolute 0.3 0 - 1 K/uL   Eosinophils Relative 1 %   Eosinophils Absolute 0.0 0 - 0 K/uL   Basophils Relative 1 %   Basophils Absolute 0.0 0 - 0 K/uL   Immature Granulocytes 0 %   Abs Immature Granulocytes 0.01 0.00 - 0.07 K/uL    Comment: Performed at [facility]  [facility], [address], [place], [place] [address]  Comprehensive metabolic panel     Status: Abnormal   Collection Time: 02/09/20  5:32 PM  Result Value Ref Range   Sodium 131 (L) 135 - 145 mmol/L   Potassium 2.7 (LL) 3.5 - 5.1 mmol/L    Comment: CRITICAL RESULT CALLED TO, READ BACK BY AND VERIFIED WITH: B.FANG RN [id number] 02/09/20 MCCORMICK K    Chloride 96 (L) 98 - 111 mmol/L   CO2 18 (L) 22 - 32 mmol/L   Glucose, Bld 78 70 - 99 mg/dL    Comment: Glucose reference range applies only to samples taken after fasting for at least 8 hours.   BUN 5 (L) 6 - 20 mg/dL   Creatinine, Ser 0.93 0.44 - 1.00 mg/dL   Calcium 9.5 8.9 - 23.5 mg/dL   Total Protein 7.2 6.5 - 8.1 g/dL   Albumin 3.9 3.5 - 5.0 g/dL   AST 29 15 - 41 U/L   ALT 30 0 - 44 U/L   Alkaline Phosphatase 40 38 - 126 U/L   Total Bilirubin 0.9 0.3 - 1.2 mg/dL   GFR calc non Af Amer >60 >60 mL/min   GFR calc Af Amer >60 >60 mL/min   Anion gap 17 (H) 5 - 15    Comment: Performed at [facility], [address] [place], [place] [address]  TSH     Status: Abnormal   Collection Time: 02/09/20  5:32 PM  Result Value Ref Range   TSH 0.028 (L) 0.350 - 4.500 uIU/mL    Comment: Performed by a 3rd Generation assay with a functional sensitivity of <=0.01 uIU/mL. Performed at [facility], [address], [place], [place] [address]     Review of Systems  Gastrointestinal: Positive for nausea and vomiting.  Genitourinary: Positive for vaginal bleeding (Spotting after intercourse on Weds. ) and vaginal discharge.   Physical Exam   Blood pressure 118/76, pulse 89, temperature 98.3 F (36.8 C), temperature source Oral, resp. rate 18, height 5\' 3"  (1.6 m), weight  57.4 kg, last menstrual period 10/21/2019, SpO2 100 %.  Physical Exam Vitals and nursing note reviewed.  Constitutional:      General: She is not in acute distress.    Appearance: Normal appearance. She is ill-appearing. She is not toxic-appearing or diaphoretic.  HENT:      Head: Normocephalic.  Pulmonary:     Effort: Pulmonary effort is normal.  Abdominal:     Palpations: Abdomen is soft.  Musculoskeletal:        General: Normal range of motion.  Skin:    General: Skin is warm.  Neurological:     General: No focal deficit present.     Mental Status: She is alert.  Psychiatric:        Mood and Affect: Mood normal.     MAU Course  Procedures  None  MDM  + fetal heart tones via doppler  Weight 60.8 kg (134 lbs) 01/15/20 Today 9/24: 57.38 kg Records reviewed from [place].   Zofran 8 mg IV Pepcid IV  LR bolus x2 MVI X 1 with 20 meq of K+ No vomiting noted after Zofran infusion.  Discussed patient with Dr. Debroah Loop, will have patient attempt oral challenge.  Kdur 20 meq X 1 Report given to Gerrit Heck CNM who resumes care of the patient.  Rasch, Harolyn Rutherford, NP  Assessment and Plan  Reassessment (10:18 PM)  -Patient reports her mouth is dry, but she feels "a little better." -She reports she does not have any current nausea. -Patient agreeable to phenergan dosing prior to discharge.  -She states she was able to eat a few crackers with issues.    Reassessment (12:18 AM) -Patient given phenergan, but declines Boost. -Reports continued feeling of "chest burning." -Reiterated that this is due to vomiting and should resolve once vomiting is controlled. -Dr. Marcie Bal contacted and updated on patient status.  Advised: *Okay for discharge. *Follow up with primary ob as scheduled. -Will send home with script for Zofran, phenergan, and Kdur 10 days. -Patient without questions or concerns.  -Encouraged to call or return to MAU if symptoms worsen or with the onset of new symptoms. -Discharged to home in stable condition.  Cherre Robins MSN, CNM Advanced Practice Provider, [facility] for [employer]

## 2020-02-09 NOTE — MAU Note (Addendum)
Was dx with hyperemesis.  Was in [place], getting care there.   Hasn't been able to keep anything down for the last 4-5 days.  Feels she is dehydrated.   Started bleeding yesterday(after intercourse), is pretty light- spotting.  Denies pain.  Has run out of nausea medication.  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

## 2020-02-09 NOTE — MAU Note (Signed)
Unable to void

## 2020-02-10 DIAGNOSIS — Z3A14 14 weeks gestation of pregnancy: Secondary | ICD-10-CM

## 2020-02-10 DIAGNOSIS — O26891 Other specified pregnancy related conditions, first trimester: Secondary | ICD-10-CM

## 2020-02-10 DIAGNOSIS — R110 Nausea: Secondary | ICD-10-CM

## 2020-02-10 DIAGNOSIS — O219 Vomiting of pregnancy, unspecified: Secondary | ICD-10-CM

## 2020-02-10 MED ORDER — ONDANSETRON 4 MG PO TBDP: 4.0000 mg | ORAL_TABLET | Freq: Three times a day (TID) | ORAL | 0 refills | Status: DC | PRN

## 2020-02-10 MED ORDER — POTASSIUM CHLORIDE CRYS ER 20 MEQ PO TBCR: 20.0000 meq | EXTENDED_RELEASE_TABLET | Freq: Every day | ORAL | 0 refills | Status: DC

## 2020-02-10 MED ORDER — PROMETHAZINE HCL 12.5 MG PO TABS: 12.5000 mg | ORAL_TABLET | Freq: Four times a day (QID) | ORAL | 0 refills | Status: DC | PRN

## 2020-02-10 NOTE — Discharge Instructions (Signed)
Hyperemesis Gravidarum Hyperemesis gravidarum is a severe form of nausea and vomiting that happens during pregnancy. Hyperemesis is worse than morning sickness. It may cause you to have nausea or vomiting all day for many days. It may keep you from eating and drinking enough food and liquids, which can lead to dehydration, malnutrition, and weight loss. Hyperemesis usually occurs during the first half (the first 20 weeks) of pregnancy. It often goes away once a woman is in her second half of pregnancy. However, sometimes hyperemesis continues through an entire pregnancy. What are the causes? The cause of this condition is not known. It may be related to changes in chemicals (hormones) in the body during pregnancy, such as the high level of pregnancy hormone (human chorionic gonadotropin) or the increase in the female sex hormone (estrogen). What are the signs or symptoms? Symptoms of this condition include:  Nausea that does not go away.  Vomiting that does not allow you to keep any food down.  Weight loss.  Body fluid loss (dehydration).  Having no desire to eat, or not liking food that you have previously enjoyed. How is this diagnosed? This condition may be diagnosed based on:  A physical exam.  Your medical history.  Your symptoms.  Blood tests.  Urine tests. How is this treated? This condition is managed by controlling symptoms. This may include:  Following an eating plan. This can help lessen nausea and vomiting.  Taking prescription medicines. An eating plan and medicines are often used together to help control symptoms. If medicines do not help relieve nausea and vomiting, you may need to receive fluids through an IV at the hospital. Follow these instructions at home: Eating and drinking   Avoid the following: ? Drinking fluids with meals. Try not to drink anything during the 30 minutes before and after your meals. ? Drinking more than 1 cup of fluid at a  time. ? Eating foods that trigger your symptoms. These may include spicy foods, coffee, high-fat foods, very sweet foods, and acidic foods. ? Skipping meals. Nausea can be more intense on an empty stomach. If you cannot tolerate food, do not force it. Try sucking on ice chips or other frozen items and make up for missed calories later. ? Lying down within 2 hours after eating. ? Being exposed to environmental triggers. These may include food smells, smoky rooms, closed spaces, rooms with strong smells, warm or humid places, overly loud and noisy rooms, and rooms with motion or flickering lights. Try eating meals in a well-ventilated area that is free of strong smells. ? Quick and sudden changes in your movement. ? Taking iron pills and multivitamins that contain iron. If you take prescription iron pills, do not stop taking them unless your health care provider approves. ? Preparing food. The smell of food can spoil your appetite or trigger nausea.  To help relieve your symptoms: ? Listen to your body. Everyone is different and has different preferences. Find what works best for you. ? Eat and drink slowly. ? Eat 5-6 small meals daily instead of 3 large meals. Eating small meals and snacks can help you avoid an empty stomach. ? In the morning, before getting out of bed, eat a couple of crackers to avoid moving around on an empty stomach. ? Try eating starchy foods as these are usually tolerated well. Examples include cereal, toast, bread, potatoes, pasta, rice, and pretzels. ? Include at least 1 serving of protein with your meals and snacks. Protein options include   lean meats, poultry, seafood, beans, nuts, nut butters, eggs, cheese, and yogurt. ? Try eating a protein-rich snack before bed. Examples of a protein-rick snack include cheese and crackers or a peanut butter sandwich made with 1 slice of whole-wheat bread and 1 tsp (5 g) of peanut butter. ? Eat or suck on things that have ginger in them.  It may help relieve nausea. Add  tsp ground ginger to hot tea or choose ginger tea. ? Try drinking 100% fruit juice or an electrolyte drink. An electrolyte drink contains sodium, potassium, and chloride. ? Drink fluids that are cold, clear, and carbonated or sour. Examples include lemonade, ginger ale, lemon-lime soda, ice water, and sparkling water. ? Brush your teeth or use a mouth rinse after meals. ? Talk with your health care provider about starting a supplement of vitamin B6. General instructions  Take over-the-counter and prescription medicines only as told by your health care provider.  Follow instructions from your health care provider about eating or drinking restrictions.  Continue to take your prenatal vitamins as told by your health care provider. If you are having trouble taking your prenatal vitamins, talk with your health care provider about different options.  Keep all follow-up and pre-birth (prenatal) visits as told by your health care provider. This is important. Contact a health care provider if:  You have pain in your abdomen.  You have a severe headache.  You have vision problems.  You are losing weight.  You feel weak or dizzy. Get help right away if:  You cannot drink fluids without vomiting.  You vomit blood.  You have constant nausea and vomiting.  You are very weak.  You faint.  You have a fever and your symptoms suddenly get worse. Summary  Hyperemesis gravidarum is a severe form of nausea and vomiting that happens during pregnancy.  Making some changes to your eating habits may help relieve nausea and vomiting.  This condition may be managed with medicine.  If medicines do not help relieve nausea and vomiting, you may need to receive fluids through an IV at the hospital. This information is not intended to replace advice given to you by your health care provider. Make sure you discuss any questions you have with your health care  provider. Document Revised: 05/24/2017 Document Reviewed: 01/01/2016 Elsevier Patient Education  2020 Elsevier Inc.  

## 2020-02-11 LAB — T3, FREE: T3, Free: 3.8 pg/mL (ref 2.0–4.4)

## 2020-02-23 ENCOUNTER — Other Ambulatory Visit: Payer: Self-pay

## 2020-02-23 ENCOUNTER — Encounter (HOSPITAL_COMMUNITY): Payer: Self-pay | Admitting: Obstetrics and Gynecology

## 2020-02-23 ENCOUNTER — Inpatient Hospital Stay (HOSPITAL_COMMUNITY)
Admission: AD | Admit: 2020-02-23 | Discharge: 2020-02-23 | Disposition: A | Discharge status: Home / Self Care | Payer: Medicaid Other | Attending: Obstetrics and Gynecology | Admitting: Obstetrics and Gynecology

## 2020-02-23 DIAGNOSIS — R110 Nausea: Secondary | ICD-10-CM | POA: Diagnosis not present

## 2020-02-23 DIAGNOSIS — O219 Vomiting of pregnancy, unspecified: Secondary | ICD-10-CM | POA: Insufficient documentation

## 2020-02-23 DIAGNOSIS — O98812 Other maternal infectious and parasitic diseases complicating pregnancy, second trimester: Secondary | ICD-10-CM | POA: Diagnosis not present

## 2020-02-23 DIAGNOSIS — R8271 Bacteriuria: Secondary | ICD-10-CM | POA: Insufficient documentation

## 2020-02-23 DIAGNOSIS — Z3A14 14 weeks gestation of pregnancy: Secondary | ICD-10-CM | POA: Diagnosis not present

## 2020-02-23 DIAGNOSIS — B951 Streptococcus, group B, as the cause of diseases classified elsewhere: Secondary | ICD-10-CM | POA: Insufficient documentation

## 2020-02-23 DIAGNOSIS — O26892 Other specified pregnancy related conditions, second trimester: Secondary | ICD-10-CM | POA: Diagnosis not present

## 2020-02-23 DIAGNOSIS — Z79899 Other long term (current) drug therapy: Secondary | ICD-10-CM | POA: Insufficient documentation

## 2020-02-23 LAB — URINALYSIS, ROUTINE W REFLEX MICROSCOPIC
Bilirubin Urine: NEGATIVE
Glucose, UA: NEGATIVE mg/dL
Hgb urine dipstick: NEGATIVE
Ketones, ur: 80 mg/dL — AB
Leukocytes,Ua: NEGATIVE
Nitrite: NEGATIVE
Protein, ur: NEGATIVE mg/dL
Specific Gravity, Urine: 1.02 (ref 1.005–1.030)
pH: 6 (ref 5.0–8.0)

## 2020-02-23 MED ORDER — DOXYLAMINE-PYRIDOXINE 10-10 MG PO TBEC: 1.0000 | DELAYED_RELEASE_TABLET | Freq: Every day | ORAL | 3 refills | Status: DC

## 2020-02-23 MED ORDER — SODIUM CHLORIDE 0.9 % IV SOLN
8.0000 mg | Freq: Once | INTRAVENOUS | Status: AC
  Administered 2020-02-23: 8 mg via INTRAVENOUS
  Filled 2020-02-23: qty 4

## 2020-02-23 MED ORDER — LACTATED RINGERS IV SOLN: INTRAVENOUS | Status: DC

## 2020-02-23 MED ORDER — SODIUM CHLORIDE 0.9 % IV SOLN
25.0000 mg | Freq: Once | INTRAVENOUS | Status: AC
  Administered 2020-02-23: 25 mg via INTRAVENOUS
  Filled 2020-02-23: qty 1

## 2020-02-23 MED ORDER — DEXAMETHASONE SODIUM PHOSPHATE 10 MG/ML IJ SOLN
10.0000 mg | Freq: Once | INTRAMUSCULAR | Status: AC
  Administered 2020-02-23: 10 mg via INTRAVENOUS
  Filled 2020-02-23: qty 1

## 2020-02-23 MED ORDER — DIPHENHYDRAMINE HCL 50 MG/ML IJ SOLN
25.0000 mg | Freq: Once | INTRAMUSCULAR | Status: AC
  Administered 2020-02-23: 25 mg via INTRAVENOUS
  Filled 2020-02-23: qty 1

## 2020-02-23 MED ORDER — GLYCOPYRROLATE 0.2 MG/ML IJ SOLN
0.2000 mg | Freq: Once | INTRAMUSCULAR | Status: AC
  Administered 2020-02-23: 0.2 mg via INTRAVENOUS
  Filled 2020-02-23: qty 1

## 2020-02-23 MED ORDER — GLYCOPYRROLATE 1 MG PO TABS: 1.0000 mg | ORAL_TABLET | Freq: Three times a day (TID) | ORAL | 2 refills | Status: DC

## 2020-02-23 MED ORDER — LACTATED RINGERS IV BOLUS
1000.0000 mL | Freq: Once | INTRAVENOUS | Status: AC
  Administered 2020-02-23: 1000 mL via INTRAVENOUS

## 2020-02-23 NOTE — MAU Note (Signed)
Pt. Reports to MAU with nausea, vomiting, and excessive saliva/spitting.  States this has been going on for past 4-5 days.  Denis any vag discharge/bleeding.

## 2020-02-23 NOTE — Discharge Instructions (Signed)
Hyperemesis Gravidarum Hyperemesis gravidarum is a severe form of nausea and vomiting that happens during pregnancy. Hyperemesis is worse than morning sickness. It may cause you to have nausea or vomiting all day for many days. It may keep you from eating and drinking enough food and liquids, which can lead to dehydration, malnutrition, and weight loss. Hyperemesis usually occurs during the first half (the first 20 weeks) of pregnancy. It often goes away once a woman is in her second half of pregnancy. However, sometimes hyperemesis continues through an entire pregnancy. What are the causes? The cause of this condition is not known. It may be related to changes in chemicals (hormones) in the body during pregnancy, such as the high level of pregnancy hormone (human chorionic gonadotropin) or the increase in the female sex hormone (estrogen). What are the signs or symptoms? Symptoms of this condition include:  Nausea that does not go away.  Vomiting that does not allow you to keep any food down.  Weight loss.  Body fluid loss (dehydration).  Having no desire to eat, or not liking food that you have previously enjoyed. How is this diagnosed? This condition may be diagnosed based on:  A physical exam.  Your medical history.  Your symptoms.  Blood tests.  Urine tests. How is this treated? This condition is managed by controlling symptoms. This may include:  Following an eating plan. This can help lessen nausea and vomiting.  Taking prescription medicines. An eating plan and medicines are often used together to help control symptoms. If medicines do not help relieve nausea and vomiting, you may need to receive fluids through an IV at the hospital. Follow these instructions at home: Eating and drinking   Avoid the following: ? Drinking fluids with meals. Try not to drink anything during the 30 minutes before and after your meals. ? Drinking more than 1 cup of fluid at a  time. ? Eating foods that trigger your symptoms. These may include spicy foods, coffee, high-fat foods, very sweet foods, and acidic foods. ? Skipping meals. Nausea can be more intense on an empty stomach. If you cannot tolerate food, do not force it. Try sucking on ice chips or other frozen items and make up for missed calories later. ? Lying down within 2 hours after eating. ? Being exposed to environmental triggers. These may include food smells, smoky rooms, closed spaces, rooms with strong smells, warm or humid places, overly loud and noisy rooms, and rooms with motion or flickering lights. Try eating meals in a well-ventilated area that is free of strong smells. ? Quick and sudden changes in your movement. ? Taking iron pills and multivitamins that contain iron. If you take prescription iron pills, do not stop taking them unless your health care provider approves. ? Preparing food. The smell of food can spoil your appetite or trigger nausea.  To help relieve your symptoms: ? Listen to your body. Everyone is different and has different preferences. Find what works best for you. ? Eat and drink slowly. ? Eat 5-6 small meals daily instead of 3 large meals. Eating small meals and snacks can help you avoid an empty stomach. ? In the morning, before getting out of bed, eat a couple of crackers to avoid moving around on an empty stomach. ? Try eating starchy foods as these are usually tolerated well. Examples include cereal, toast, bread, potatoes, pasta, rice, and pretzels. ? Include at least 1 serving of protein with your meals and snacks. Protein options include   lean meats, poultry, seafood, beans, nuts, nut butters, eggs, cheese, and yogurt. ? Try eating a protein-rich snack before bed. Examples of a protein-rick snack include cheese and crackers or a peanut butter sandwich made with 1 slice of whole-wheat bread and 1 tsp (5 g) of peanut butter. ? Eat or suck on things that have ginger in them.  It may help relieve nausea. Add  tsp ground ginger to hot tea or choose ginger tea. ? Try drinking 100% fruit juice or an electrolyte drink. An electrolyte drink contains sodium, potassium, and chloride. ? Drink fluids that are cold, clear, and carbonated or sour. Examples include lemonade, ginger ale, lemon-lime soda, ice water, and sparkling water. ? Brush your teeth or use a mouth rinse after meals. ? Talk with your health care provider about starting a supplement of vitamin B6. General instructions  Take over-the-counter and prescription medicines only as told by your health care provider.  Follow instructions from your health care provider about eating or drinking restrictions.  Continue to take your prenatal vitamins as told by your health care provider. If you are having trouble taking your prenatal vitamins, talk with your health care provider about different options.  Keep all follow-up and pre-birth (prenatal) visits as told by your health care provider. This is important. Contact a health care provider if:  You have pain in your abdomen.  You have a severe headache.  You have vision problems.  You are losing weight.  You feel weak or dizzy. Get help right away if:  You cannot drink fluids without vomiting.  You vomit blood.  You have constant nausea and vomiting.  You are very weak.  You faint.  You have a fever and your symptoms suddenly get worse. Summary  Hyperemesis gravidarum is a severe form of nausea and vomiting that happens during pregnancy.  Making some changes to your eating habits may help relieve nausea and vomiting.  This condition may be managed with medicine.  If medicines do not help relieve nausea and vomiting, you may need to receive fluids through an IV at the hospital. This information is not intended to replace advice given to you by your health care provider. Make sure you discuss any questions you have with your health care  provider. Document Revised: 05/24/2017 Document Reviewed: 01/01/2016 Elsevier Patient Education  2020 Elsevier Inc.  

## 2020-02-23 NOTE — MAU Provider Note (Signed)
Chief Complaint:  Emesis   None    HPI: Lynn Davis is a 29 y.o. G2P0010 at [redacted]w[redacted]d who presents to maternity admissions reporting nausea, vomiting and spitting. This is an ongoing problem that has gotten worse in the past few days. She has been unable to keep down solids or many fluids for 3 days. Denies vaginal bleeding, leaking of fluid, decreased fetal movement, fever, falls, or recent illness.   Pregnancy Course: Uncomplicated, GBS bacteruria  Past Medical History:  Diagnosis Date  . Medical history non-contributory    OB History  Gravida Para Term Preterm AB Living  2       1    SAB TAB Ectopic Multiple Live Births    1          # Outcome Date GA Lbr Len/2nd Weight Sex Delivery Anes PTL Lv  2 Current           1 TAB 2012           Past Surgical History:  Procedure Laterality Date  . NO PAST SURGERIES     Family History  Problem Relation Age of Onset  . Hyperlipidemia Mother   . Emphysema Father    Social History   Tobacco Use  . Smoking status: Never Smoker  . Smokeless tobacco: Never Used  Vaping Use  . Vaping Use: Never used  Substance Use Topics  . Alcohol use: Not Currently    Comment: not often  . Drug use: Not Currently    Frequency: 7.0 times per week    Types: Marijuana   No Known Allergies Medications Prior to Admission  Medication Sig Dispense Refill Last Dose  . Multiple Vitamin (MULTIVITAMIN) tablet Take 1 tablet by mouth daily.   02/23/2020 at Unknown time  . ondansetron (ZOFRAN ODT) 4 MG disintegrating tablet Take 1 tablet (4 mg total) by mouth every 8 (eight) hours as needed for nausea or vomiting. 20 tablet 0 02/23/2020 at Unknown time  . alum & mag hydroxide-simeth (MAALOX ADVANCED MAX ST) 400-400-40 MG/5ML suspension Take 10 mLs by mouth every 6 (six) hours as needed for indigestion. 355 mL 0   . potassium chloride SA (KLOR-CON) 20 MEQ tablet Take 1 tablet (20 mEq total) by mouth daily. 10 tablet 0   . promethazine (PHENERGAN) 12.5 MG  tablet Take 1 tablet (12.5 mg total) by mouth every 6 (six) hours as needed for nausea or vomiting. 30 tablet 0     I have reviewed patient's Past Medical Hx, Surgical Hx, Family Hx, Social Hx, medications and allergies.   ROS:  Review of Systems  Constitutional: Negative for fever.  Respiratory: Negative for cough and shortness of breath.   Gastrointestinal: Positive for nausea and vomiting. Negative for constipation and diarrhea.       Excessive salivation and spitting  Genitourinary: Negative for pelvic pain, vaginal bleeding and vaginal pain.  Neurological: Negative for headaches.  All other systems reviewed and are negative.   Physical Exam   Patient Vitals for the past 24 hrs:  BP Temp Temp src Pulse Resp SpO2 Height Weight  02/23/20 1622 112/76 98.3 F (36.8 C) Oral 84 16 100 % 5\' 3"  (1.6 m) 134 lb (60.8 kg)    Constitutional: Well-developed, well-nourished female in no acute distress.  Cardiovascular: normal rate & rhythm, no murmur Respiratory: normal effort, lung sounds clear throughout GI: Abd soft, non-tender, gravid appropriate for gestational age. Pos BS x 4 MS: Extremities nontender, no edema, normal ROM Neurologic: Alert  and oriented x 4.  Pelvic exam deferred    FHR: 155   Labs: Results for orders placed or performed during the hospital encounter of 02/23/20 (from the past 24 hour(s))  Urinalysis, Routine w reflex microscopic Urine, Clean Catch     Status: Abnormal   Collection Time: 02/23/20  4:33 PM  Result Value Ref Range   Color, Urine YELLOW YELLOW   APPearance HAZY (A) CLEAR   Specific Gravity, Urine 1.020 1.005 - 1.030   pH 6.0 5.0 - 8.0   Glucose, UA NEGATIVE NEGATIVE mg/dL   Hgb urine dipstick NEGATIVE NEGATIVE   Bilirubin Urine NEGATIVE NEGATIVE   Ketones, ur 80 (A) NEGATIVE mg/dL   Protein, ur NEGATIVE NEGATIVE mg/dL   Nitrite NEGATIVE NEGATIVE   Leukocytes,Ua NEGATIVE NEGATIVE    Imaging:  No results found.  MAU Course: Orders  Placed This Encounter  Procedures  . Urinalysis, Routine w reflex microscopic Urine, Clean Catch  . Discharge patient   Meds ordered this encounter  Medications  . promethazine (PHENERGAN) 25 mg in sodium chloride 0.9 % 1,000 mL infusion  . AND Linked Order Group   . diphenhydrAMINE (BENADRYL) injection 25 mg   . dexamethasone (DECADRON) injection 10 mg  . glycopyrrolate (ROBINUL) injection 0.2 mg  . lactated ringers infusion  . lactated ringers bolus 1,000 mL  . ondansetron (ZOFRAN) 8 mg in sodium chloride 0.9 % 50 mL IVPB  . Doxylamine-Pyridoxine (DICLEGIS) 10-10 MG TBEC    Sig: Take 1-2 tablets by mouth at bedtime. Start by taking two tablets at bedtime on day 1 and 2; if symptoms persist, take 1 tablet in morning and 2 tablets at bedtime on day 3; if symptoms persist, may increase to 1 tablet in morning, 1 tablet mid-afternoon, and 2 tablets at bedtime on day 4 (maximum: doxylamine 40 mg/pyridoxine 40 mg (4 tablets) per day).    Dispense:  60 tablet    Refill:  3    Order Specific Question:   Supervising Provider    Answer:   Reva Bores [[id number]]  . glycopyrrolate (ROBINUL) 1 MG tablet    Sig: Take 1 tablet (1 mg total) by mouth 3 (three) times daily.    Dispense:  90 tablet    Refill:  2    Order Specific Question:   Supervising Provider    Answer:   Samara Snide    MDM: Phenergan, headache cocktail and robinul given with good relief of her headache and some relief of nausea/spitting Pt still reported nausea, gave zofran IV. Pt able to keep down Sprite.  Assessment: 1. Nausea and vomiting during pregnancy prior to [redacted] weeks gestation     Plan: Discharge home in stable condition with return precautions.    Follow-up Information    [facility]. Call.   Specialty: Obstetrics and Gynecology Why: to establish routine OB care Contact information: [address] [place]  [phone]              Allergies as of 02/23/2020   No Known Allergies     Medication List    STOP taking these medications   alum & mag hydroxide-simeth 400-400-40 MG/5ML suspension Commonly known as: Maalox Advanced Max St   potassium chloride SA 20 MEQ tablet Commonly known as: KLOR-CON   promethazine 12.5 MG tablet Commonly known as: PHENERGAN     TAKE these medications   Doxylamine-Pyridoxine 10-10 MG Tbec Commonly known as:  Diclegis Take 1-2 tablets by mouth at bedtime. Start by taking two tablets at bedtime on day 1 and 2; if symptoms persist, take 1 tablet in morning and 2 tablets at bedtime on day 3; if symptoms persist, may increase to 1 tablet in morning, 1 tablet mid-afternoon, and 2 tablets at bedtime on day 4 (maximum: doxylamine 40 mg/pyridoxine 40 mg (4 tablets) per day).   glycopyrrolate 1 MG tablet Commonly known as: Robinul Take 1 tablet (1 mg total) by mouth 3 (three) times daily.   multivitamin tablet Take 1 tablet by mouth daily.   ondansetron 4 MG disintegrating tablet Commonly known as: Zofran ODT Take 1 tablet (4 mg total) by mouth every 8 (eight) hours as needed for nausea or vomiting.      Edd Arbour, CNM, MSN, [facility] 02/23/20 9:31 PM

## 2020-02-27 ENCOUNTER — Encounter: Payer: Self-pay | Admitting: General Practice

## 2020-03-11 ENCOUNTER — Encounter: Payer: Self-pay | Admitting: Obstetrics and Gynecology

## 2020-03-11 ENCOUNTER — Other Ambulatory Visit: Payer: Self-pay | Admitting: *Deleted

## 2020-03-19 ENCOUNTER — Inpatient Hospital Stay (HOSPITAL_COMMUNITY)
Admission: AD | Admit: 2020-03-19 | Discharge: 2020-03-19 | Disposition: A | Discharge status: Home / Self Care | Payer: Medicaid Other | Attending: Obstetrics & Gynecology | Admitting: Obstetrics & Gynecology

## 2020-03-19 DIAGNOSIS — Z79899 Other long term (current) drug therapy: Secondary | ICD-10-CM | POA: Diagnosis not present

## 2020-03-19 DIAGNOSIS — M7918 Myalgia, other site: Secondary | ICD-10-CM | POA: Diagnosis not present

## 2020-03-19 DIAGNOSIS — Z3492 Encounter for supervision of normal pregnancy, unspecified, second trimester: Secondary | ICD-10-CM

## 2020-03-19 DIAGNOSIS — Z3A18 18 weeks gestation of pregnancy: Secondary | ICD-10-CM

## 2020-03-19 DIAGNOSIS — O26892 Other specified pregnancy related conditions, second trimester: Secondary | ICD-10-CM | POA: Diagnosis present

## 2020-03-19 DIAGNOSIS — R1030 Lower abdominal pain, unspecified: Secondary | ICD-10-CM

## 2020-03-19 DIAGNOSIS — Z3490 Encounter for supervision of normal pregnancy, unspecified, unspecified trimester: Secondary | ICD-10-CM

## 2020-03-19 LAB — URINALYSIS, ROUTINE W REFLEX MICROSCOPIC
Bilirubin Urine: NEGATIVE
Glucose, UA: NEGATIVE mg/dL
Hgb urine dipstick: NEGATIVE
Ketones, ur: 5 mg/dL — AB
Leukocytes,Ua: NEGATIVE
Nitrite: NEGATIVE
Protein, ur: NEGATIVE mg/dL
Specific Gravity, Urine: 1.021 (ref 1.005–1.030)
pH: 6 (ref 5.0–8.0)

## 2020-03-19 NOTE — Discharge Instructions (Signed)

## 2020-03-19 NOTE — MAU Note (Signed)
PT SAYS SHE HAS SHARP PAIN IN LOWER ABD - STARTED YESTERDAY - WORSE TODAY-  NO MEDS FOR PAIN . NAUSEA STARTED YESTERDAY -  NO MEDS. [facility] -  [place]- - DID NOT CALL.

## 2020-03-19 NOTE — MAU Provider Note (Signed)
History     CSN: [id number]  Arrival date and time: 03/19/20 1751   First Provider Initiated Contact with Patient 03/19/20 2029      Chief Complaint  Patient presents with  . Abdominal Pain   HPI Lynn Davis is a 29 y.o. G2P0010 at [redacted]w[redacted]d who presents to MAU with chief complaint of lower abdominal pain. This is a new problem, onset yesterday 03/18/2020. She has not taken medication or tried other treatments for this complaint. She denies aggravating or alleviating factors. She denies vaginal bleeding, dysuria, abdominal tenderness, fever or recent illness. She is remote from sexual intercourse.  Patient verbalizes concern that she has not been contacted regarding outpatient anatomy scan. She poses several questions related to scheduling and location of ultrasound as well as how the scan can accommodate her plans for a gender reveal.  Patient is transferring care from [facility] to [facility] and has her first appointment 03/21/2020.  OB History    Gravida  2   Para      Term      Preterm      AB  1   Living        SAB      TAB  1   Ectopic      Multiple      Live Births              Past Medical History:  Diagnosis Date  . Medical history non-contributory     Past Surgical History:  Procedure Laterality Date  . NO PAST SURGERIES      Family History  Problem Relation Age of Onset  . Hyperlipidemia Mother   . Emphysema Father     Social History   Tobacco Use  . Smoking status: Never Smoker  . Smokeless tobacco: Never Used  Vaping Use  . Vaping Use: Never used  Substance Use Topics  . Alcohol use: Not Currently    Comment: not often  . Drug use: Not Currently    Frequency: 7.0 times per week    Types: Marijuana    Allergies: No Known Allergies  Medications Prior to Admission  Medication Sig Dispense Refill Last Dose  . Doxylamine-Pyridoxine (DICLEGIS) 10-10 MG TBEC Take 1-2 tablets by mouth at bedtime. Start by taking two  tablets at bedtime on day 1 and 2; if symptoms persist, take 1 tablet in morning and 2 tablets at bedtime on day 3; if symptoms persist, may increase to 1 tablet in morning, 1 tablet mid-afternoon, and 2 tablets at bedtime on day 4 (maximum: doxylamine 40 mg/pyridoxine 40 mg (4 tablets) per day). 60 tablet 3   . glycopyrrolate (ROBINUL) 1 MG tablet Take 1 tablet (1 mg total) by mouth 3 (three) times daily. 90 tablet 2   . Multiple Vitamin (MULTIVITAMIN) tablet Take 1 tablet by mouth daily.     . ondansetron (ZOFRAN ODT) 4 MG disintegrating tablet Take 1 tablet (4 mg total) by mouth every 8 (eight) hours as needed for nausea or vomiting. 20 tablet 0     Review of Systems  Gastrointestinal: Positive for abdominal pain.  All other systems reviewed and are negative.  Physical Exam   Blood pressure 110/67, pulse 85, temperature 98.7 F (37.1 C), temperature source Oral, resp. rate 20, height 5\' 4"  (1.626 m), weight 64.5 kg, last menstrual period 10/21/2019.  Physical Exam Vitals and nursing note reviewed. Exam conducted with a chaperone present.  Cardiovascular:     Rate and Rhythm: Normal rate.  Heart sounds: Normal heart sounds.  Pulmonary:     Effort: Pulmonary effort is normal.  Abdominal:     General: Abdomen is flat.     Palpations: Abdomen is soft.     Tenderness: There is no abdominal tenderness.  Genitourinary:    Comments: Deferred due to duration and character of symptoms, absence of vaginal bleeding Skin:    General: Skin is dry.     Capillary Refill: Capillary refill takes less than 2 seconds.  Neurological:     General: No focal deficit present.     Mental Status: She is alert.  Psychiatric:        Mood and Affect: Mood normal.        Behavior: Behavior normal.     MAU Course  Procedures  --Benign physical exam --Pertinent negatives: vaginal bleeding, CVAT, hematuria --Advised Tylenol 650 mg q 4 hours for musculoskeletal pain  Patient Vitals for the past 24  hrs:  BP Temp Temp src Pulse Resp Height Weight  03/19/20 1931 110/67 98.7 F (37.1 C) Oral 85 20 5\' 4"  (1.626 m) 64.5 kg   Results for orders placed or performed during the hospital encounter of 03/19/20 (from the past 24 hour(s))  Urinalysis, Routine w reflex microscopic Urine, Clean Catch     Status: Abnormal   Collection Time: 03/19/20  7:34 PM  Result Value Ref Range   Color, Urine YELLOW YELLOW   APPearance HAZY (A) CLEAR   Specific Gravity, Urine 1.021 1.005 - 1.030   pH 6.0 5.0 - 8.0   Glucose, UA NEGATIVE NEGATIVE mg/dL   Hgb urine dipstick NEGATIVE NEGATIVE   Bilirubin Urine NEGATIVE NEGATIVE   Ketones, ur 5 (A) NEGATIVE mg/dL   Protein, ur NEGATIVE NEGATIVE mg/dL   Nitrite NEGATIVE NEGATIVE   Leukocytes,Ua NEGATIVE NEGATIVE   Assessment and Plan  --29 y.o. G2P0010 at [redacted]w[redacted]d  --FHT 150 by Doppler --No concerning findings on physical exam --Discharge home in stable condition with second trimester precautions  F/U: --New OB at [facility] 03/21/2020  13/08/2019, CNM 03/19/2020, 11:36 PM

## 2020-03-21 ENCOUNTER — Encounter: Payer: Self-pay | Admitting: General Practice

## 2020-03-21 ENCOUNTER — Ambulatory Visit (INDEPENDENT_AMBULATORY_CARE_PROVIDER_SITE_OTHER): Payer: Medicaid Other | Admitting: Obstetrics and Gynecology

## 2020-03-21 ENCOUNTER — Encounter: Payer: Self-pay | Admitting: Obstetrics and Gynecology

## 2020-03-21 ENCOUNTER — Other Ambulatory Visit: Payer: Self-pay

## 2020-03-21 VITALS — BP 114/76 | HR 88 | Temp 98.6°F | Wt 141.2 lb

## 2020-03-21 DIAGNOSIS — Z3A18 18 weeks gestation of pregnancy: Secondary | ICD-10-CM | POA: Diagnosis not present

## 2020-03-21 DIAGNOSIS — K117 Disturbances of salivary secretion: Secondary | ICD-10-CM

## 2020-03-21 DIAGNOSIS — Z3490 Encounter for supervision of normal pregnancy, unspecified, unspecified trimester: Secondary | ICD-10-CM | POA: Diagnosis not present

## 2020-03-21 MED ORDER — GOJJI WEIGHT SCALE MISC: 1.0000 | Freq: Every day | 0 refills | Status: DC | PRN

## 2020-03-21 MED ORDER — GLYCOPYRROLATE 1 MG PO TABS: 1.0000 mg | ORAL_TABLET | Freq: Three times a day (TID) | ORAL | 0 refills | Status: DC

## 2020-03-21 MED ORDER — BLOOD PRESSURE MONITOR AUTOMAT DEVI: 1.0000 | Freq: Every day | 0 refills | Status: DC

## 2020-03-21 MED ORDER — PRENATAL 27-1 MG PO TABS: 1.0000 | ORAL_TABLET | Freq: Every day | ORAL | 12 refills | Status: DC

## 2020-03-21 NOTE — Progress Notes (Signed)
INITIAL OBSTETRICAL VISIT Patient name: Lynn Davis MRN [id number]  Date of birth: [birth year] Chief Complaint:   Initial Prenatal Visit (Transfer)  History of Present Illness:   Lynn Davis is a 29 y.o. G61P0010 African American female at [redacted]w[redacted]d by LMP with an Estimated Date of Delivery: 08/18/20 being seen today for her initial obstetrical visit.  Her obstetrical history is significant for hyperemesis gravidarum. Transferring care from [place] (records in scanned records). This is a planned pregnancy. She and the father of the baby (FOB) "Jamal" live together. She has a support system that consists of the FOB/family/friends. Today she reports no complaints.   Patient's last menstrual period was 10/21/2019. Last pap on 01/10/2020. Results were: normal Review of Systems:   Pertinent items are noted in HPI Denies cramping/contractions, leakage of fluid, vaginal bleeding, abnormal vaginal discharge w/ itching/odor/irritation, headaches, visual changes, shortness of breath, chest pain, abdominal pain, severe nausea/vomiting, or problems with urination or bowel movements unless otherwise stated above.  Pertinent History Reviewed:  Reviewed past medical,surgical, social, obstetrical and family history.  Reviewed problem list, medications and allergies. OB History  Gravida Para Term Preterm AB Living  2       1    SAB TAB Ectopic Multiple Live Births    1          # Outcome Date GA Lbr Len/2nd Weight Sex Delivery Anes PTL Lv  2 Current           1 TAB 2012           Physical Assessment:   Vitals:   03/21/20 1350  BP: 114/76  Pulse: 88  Temp: 98.6 F (37 C)  Weight: 141 lb 3.2 oz (64 kg)  Body mass index is 24.24 kg/m.       Physical Examination:  General appearance - well appearing, and in no distress  Mental status - alert, oriented to person, place, and time  Psych:  She has a normal mood and affect  Skin - warm and dry, normal color, no suspicious lesions  noted  Chest - effort normal, all lung fields clear to auscultation bilaterally  Heart - normal rate and regular rhythm  Abdomen - soft, nontender  Extremities:  No swelling or varicosities noted  Pelvic - VULVA: normal appearing vulva with no masses, tenderness or lesions  VAGINA: normal appearing vagina with normal color and discharge, no lesions.   CERVIX: normal appearing cervix without discharge or lesions, no CMT  Thin prep pap is not done   FHTs by doppler: 145 bpm  Assessment & Plan:  1) Low-Risk Pregnancy G2P0010 at [redacted]w[redacted]d with an Estimated Date of Delivery: 08/18/20   2) Initial OB visit - Welcomed to practice and introduced self to patient in addition to discussing other advanced practice providers that she may be seeing at this practice - Congratulated patient - Anticipatory guidance on upcoming appointments - Educated on COVID19 and pregnancy and the integration of virtual appointments  - Educated on babyscripts app- patient reports she has not received email, encouraged to look in spam folder and to call office if she still has not received email - patient verbalizes understanding    3) Encounter for supervision of low-risk pregnancy, antepartum - Prenatal 27-1 MG TABS; Take 1 tablet by mouth daily.  Dispense: 30 tablet; Refill: 12 - Genetic Screening - CBC/D/Plt+RPR+Rh+ABO+Rub Ab... - Misc. Devices (GOJJI WEIGHT SCALE) MISC; 1 Device by Does not apply route daily as needed. To weight self daily as  needed at home. ICD-10 code: Z34.90  Dispense: 1 each; Refill: 0 - Blood Pressure Monitoring (BLOOD PRESSURE MONITOR AUTOMAT) DEVI; 1 Device by Does not apply route daily. Automatic blood pressure cuff regular size. To monitor blood pressure regularly at home. ICD-10 code:Z34.90  Dispense: 1 each; Refill: 0 - [place] MFM OB COMP + 14 WK; Future  4) Ptyalism - Rx for glycopyrrolate (ROBINUL) 1 MG tablet; Take 1 tablet (1 mg total) by mouth 3 (three) times daily.  Dispense: 90 tablet;  Refill: 0 - Patient reports Robinul has not helped in the past, but she is willing to try it again. - Advised to try to suck on sour candies, eat sour citrus fruits, increase water intake along with the medication prescribed. - Reassurance given that this is a normal     Meds:  Meds ordered this encounter  Medications   Prenatal 27-1 MG TABS    Sig: Take 1 tablet by mouth daily.    Dispense:  30 tablet    Refill:  12   DISCONTD: Blood Pressure Monitoring (BLOOD PRESSURE MONITOR AUTOMAT) DEVI    Sig: 1 Device by Does not apply route daily. Automatic blood pressure cuff regular size. To monitor blood pressure regularly at home. ICD-10 code:Z34.90    Dispense:  1 each    Refill:  0   DISCONTD: Misc. Devices (GOJJI WEIGHT SCALE) MISC    Sig: 1 Device by Does not apply route daily as needed. To weight self daily as needed at home. ICD-10 code: Z34.90    Dispense:  1 each    Refill:  0   Misc. Devices (GOJJI WEIGHT SCALE) MISC    Sig: 1 Device by Does not apply route daily as needed. To weight self daily as needed at home. ICD-10 code: Z34.90    Dispense:  1 each    Refill:  0   Blood Pressure Monitoring (BLOOD PRESSURE MONITOR AUTOMAT) DEVI    Sig: 1 Device by Does not apply route daily. Automatic blood pressure cuff regular size. To monitor blood pressure regularly at home. ICD-10 code:Z34.90    Dispense:  1 each    Refill:  0   glycopyrrolate (ROBINUL) 1 MG tablet    Sig: Take 1 tablet (1 mg total) by mouth 3 (three) times daily.    Dispense:  90 tablet    Refill:  0    Order Specific Question:   Supervising Provider    Answer:   Reva Bores [[id number]]    Initial labs obtained Continue prenatal vitamins Reviewed n/v relief measures and warning s/s to report Reviewed recommended weight gain based on pre-gravid BMI Encouraged well-balanced diet Genetic Screening discussed: results reviewed Cystic fibrosis, SMA, Fragile X screening discussed results reviewed The nature of   - [facility] Practice with multiple MDs and other Advanced Practice Providers was explained to patient; also emphasized that residents, students are part of our team.  Discussed optimized OB schedule and video visits. Advised can have an in-office visit whenever she feels she needs to be seen.  Does not have own BP cuff. BP cuff Rx faxed today. Explained to patient that BP will be mailed to her house. Check BP weekly, let us know if >140/90. Advised to call during normal business hours and there is an after-hours nurse line available.    Follow-up: Return in about 4 weeks (around 04/18/2020) for Return OB - My Chart video.   Orders Placed This Encounter  Procedures   [place]  MFM OB COMP + 14 WK   TSH+Free T4   OB RESULTS CONSOLE Hemoglobin and hematocrit, blood   OB RESULTS CONSOLE PLATELET COUNT   OB RESULTS CONSOLE GC/Chlamydia   OB RESULTS CONSOLE Hemoglobin and hematocrit, blood   OB RESULTS CONSOLE PLATELET COUNT   Genetic Screening   CBC/D/Plt+RPR+Rh+ABO+Rub Ab...   OB RESULTS CONSOLE Antibody Screen   OB RESULTS CONSOLE Antibody Screen    Raelyn Mora MSN, CNM 03/21/2020

## 2020-03-22 LAB — CBC/D/PLT+RPR+RH+ABO+RUB AB...
Antibody Screen: NEGATIVE
Basophils Absolute: 0 10*3/uL (ref 0.0–0.2)
Basos: 0 %
EOS (ABSOLUTE): 0.2 10*3/uL (ref 0.0–0.4)
Eos: 5 %
HCV Ab: <0.1 s/co ratio (ref 0.0–0.9)
HIV Screen 4th Generation wRfx: NONREACTIVE
Hematocrit: 32.8 % — ABNORMAL LOW (ref 34.0–46.6)
Hemoglobin: 10.7 g/dL — ABNORMAL LOW (ref 11.1–15.9)
Hepatitis B Surface Ag: NEGATIVE
Immature Grans (Abs): 0 10*3/uL (ref 0.0–0.1)
Immature Granulocytes: 0 %
Lymphocytes Absolute: 1.2 10*3/uL (ref 0.7–3.1)
Lymphs: 23 %
MCH: 27.7 pg (ref 26.6–33.0)
MCHC: 32.6 g/dL (ref 31.5–35.7)
MCV: 85 fL (ref 79–97)
Monocytes Absolute: 0.4 10*3/uL (ref 0.1–0.9)
Monocytes: 8 %
Neutrophils Absolute: 3.1 10*3/uL (ref 1.4–7.0)
Neutrophils: 64 %
Platelets: 203 10*3/uL (ref 150–450)
RBC: 3.86 x10E6/uL (ref 3.77–5.28)
RDW: 13.5 % (ref 11.7–15.4)
RPR Ser Ql: NONREACTIVE
Rh Factor: POSITIVE
Rubella Antibodies, IGG: 3.16 index (ref >0.99)
WBC: 4.9 10*3/uL (ref 3.4–10.8)

## 2020-03-22 LAB — THYROID PANEL WITH TSH
Free Thyroxine Index: 1.9 (ref 1.2–4.9)
T3 Uptake Ratio: 16 % — ABNORMAL LOW (ref 24–39)
T4, Total: 11.6 ug/dL (ref 4.5–12.0)
TSH: 0.622 u[IU]/mL (ref 0.450–4.500)

## 2020-03-22 LAB — HCV INTERPRETATION

## 2020-03-28 ENCOUNTER — Encounter: Payer: Self-pay | Admitting: General Practice

## 2020-04-03 ENCOUNTER — Encounter: Payer: Self-pay | Admitting: General Practice

## 2020-04-03 ENCOUNTER — Other Ambulatory Visit: Payer: Self-pay | Admitting: Obstetrics and Gynecology

## 2020-04-03 DIAGNOSIS — O285 Abnormal chromosomal and genetic finding on antenatal screening of mother: Secondary | ICD-10-CM

## 2020-04-03 NOTE — Progress Notes (Signed)
Orders entered

## 2020-04-05 ENCOUNTER — Other Ambulatory Visit: Payer: Self-pay

## 2020-04-05 ENCOUNTER — Ambulatory Visit: Payer: Medicaid Other | Attending: Obstetrics and Gynecology

## 2020-04-05 ENCOUNTER — Other Ambulatory Visit: Payer: Self-pay | Admitting: Obstetrics and Gynecology

## 2020-04-05 DIAGNOSIS — Z3490 Encounter for supervision of normal pregnancy, unspecified, unspecified trimester: Secondary | ICD-10-CM

## 2020-04-08 ENCOUNTER — Other Ambulatory Visit: Payer: Self-pay | Admitting: *Deleted

## 2020-04-08 ENCOUNTER — Ambulatory Visit: Payer: Medicaid Other | Attending: Obstetrics and Gynecology | Admitting: Genetic Counselor

## 2020-04-08 ENCOUNTER — Other Ambulatory Visit: Payer: Self-pay

## 2020-04-08 ENCOUNTER — Ambulatory Visit: Payer: Self-pay | Admitting: Obstetrics and Gynecology

## 2020-04-08 DIAGNOSIS — Z315 Encounter for genetic counseling: Secondary | ICD-10-CM

## 2020-04-08 DIAGNOSIS — D563 Thalassemia minor: Secondary | ICD-10-CM

## 2020-04-08 NOTE — Progress Notes (Addendum)
04/08/2020  Lynn Davis [birth year] MRN: [id number] DOV: 04/08/2020   Patient location: home Provider location: [facility], [facility] office Persons involved in the visit: patient, provider  I connected with Lynn Davis on 04/08/2020 at 2:15 PM EST by WebEx and verified that I am speaking with the correct person using two identifiers. Lynn Davis was referred to the [facility] for [facility] for a genetics consultation regarding her carrier status for alpha-thalassemia. Lynn Davis presented to her appointment alone.  Indication for genetic counseling - Silent carrier for alpha-thalassemia  Prenatal history  Lynn Davis is a G41P0010, 29 y.o. female. Per records, this is Lynn Davis's second pregnancy. However, she reported to [place] that this is her first pregnancy. Her current pregnancy has completed [redacted]w[redacted]d (Estimated Date of Delivery: 08/18/20).  Lynn Davis denied exposure to environmental toxins or chemical agents. She denied the use of alcohol, tobacco or street drugs. She reported taking prenatal vitamins and nausea medications. She denied significant viral illnesses, fevers, and bleeding during the course of her pregnancy. Her medical and surgical histories were noncontributory.  Family History  A three generation pedigree was drafted and reviewed. Both family histories were reviewed and found to be noncontributory for birth defects, intellectual disability, recurrent pregnancy loss, and known genetic conditions. Lynn Davis had limited information about portions of her and her partner's family histories; thus, risk assessment was limited.  The patient's ancestry is [place]. The father of the pregnancy's ancestry is [place], [place], and [place]. Ashkenazi Jewish ancestry and consanguinity were denied. Pedigree will be scanned under Media.  Discussion  Alpha-thalassemia:  Lynn Davis had [id number] carrier screening performed  through [place]. The results of the screen identified her as a silent carrier for alpha-thalassemia (aa/a-). Alpha-thalassemia is different in its inheritance compared to other hemoglobinopathies as there are two copies of two alpha globin genes (HBA1 and HBA2) on each chromosome 16, or four alpha globin genes total (aa/aa). A person can be a carrier of one alpha gene mutation (aa/a-), also referred to as a "silent carrier". A person who carries two alpha globin gene mutations can either carry them in cis (both on the same chromosome, denoted as aa/--) or in trans (on different chromosomes, denoted as a-/a-). Alpha-thalassemia carriers of two mutations who have African American ancestry are more likely to have a trans arrangement (a-/a-); cis configuration is reported to be rare in individuals with African American ancestry.     There are several different forms of alpha-thalassemia. The most severe form of alpha-thalassemia, Hb Barts, is associated with an absence of alpha globin chain synthesis as a result of deletions of all four alpha globin genes (--/--).  Given that Lynn Davis is a silent carrier (aa/a-), her pregnancies would not be at increased risk for Hb Barts, even if her partner is a carrier for alpha-thalassemia, as she will always pass on at least one copy of the alpha globin gene to her children. Hemoglobin H (HbH) disease is caused by three deleted or dysfunctioning alpha globin alleles (a-/--) and is characterized by microcytic hypochromic hemolytic anemia, hepatosplenomegaly, mild jaundice, growth retardation, and sometimes thalassemia-like bone changes. Given Lynn Davis's silent carrier status (aa/a-), the current fetus would only be at risk for HbH disease (a-/--), if her partner is a carrier for two alpha globin mutations in cis (aa/--). If this is the case, the risk for HbH disease in the pregnancy would be 1 in 4 (25%). However, if Lynn Davis's partner  is a carrier for two alpha globin  mutations, he would be more likely to carry them in trans configuration (a-/a-) than the cis configuration (aa/--), given his ethnicity. If he is a carrier of alpha-thalassemia in trans, then the pregnancy would not be at increased risk for HbH disease.Based on the carrier frequency for alpha-thalassemia in the African American population, Lynn Davis's partner has a 1 in 30 chance of being any type of carrier for alpha-thalassemia.   Other carrier screening results:  Lynn Davis carrier screening was negative for the other 13 conditions screened. Thus, her risk to be a carrier for these additional conditions (listed separately in the laboratory report) has been reduced but not eliminated. This also significantly reduces her risk of having a child affected by one of these conditions. We discussed that carrier testing for alpha-thalassemia is recommended for Lynn Davis's partner. Lynn Davis indicated that she may be interested in pursuing partner carrier screening.  Aneuploidy screening results:  We also reviewed that Lynn Davis had Panorama NIPS through the laboratory [facility] that was low-risk for fetal aneuploidies. We reviewed that these results showed a less than 1 in 10,000 risk for trisomies 21, 18 and 13, and monosomy X (Turner syndrome).  In addition, the risk for triploidy and sex chromosome trisomies (47,XXX and 47,XXY) was also low. Lynn Davis elected to have cfDNA analysis for 22q11.2 deletion syndrome, which was also low risk (1 in 9000). We reviewed that while this testing identifies 94-99% of pregnancies with trisomy 68, trisomy 33, trisomy 54, sex chromosome aneuploidies, and triploidy, it is NOT diagnostic. A positive test result requires confirmation by CVS or amniocentesis, and a negative test result does not rule out a fetal chromosome abnormality. She also understands that this testing does not identify all genetic conditions.  Diagnostic testing:  Lynn Davis was also counseled  regarding diagnostic testing via amniocentesis. We discussed the technical aspects of the procedure and quoted up to a 1 in 500 (0.2%) risk for spontaneous pregnancy loss or other adverse pregnancy outcomes as a result of amniocentesis. Cultured cells from an amniocentesis sample allow for the visualization of a fetal karyotype, which can detect >99% of large chromosomal aberrations. Chromosomal microarray can also be performed to identify smaller deletions or duplications of fetal chromosomal material. Amniocentesis could also be performed to assess whether the baby is affected by alpha-thalassemia. After careful consideration, Ms. Platte declined amniocentesis at this time. She understands that amniocentesis is available at any point after 16 weeks of pregnancy and that she may opt to undergo the procedure at a later date should she change her mind.  Plan:  Ms. Pore indicated that she may be interested in pursuing alpha-thalassemia carrier screening for her partner Orlie Pollen. However, she wanted to discuss this option with him prior to ordering testing. Ms. Marse was provided with my contact information and encouraged to reach out to me if partner carrier screening is desired. If she emails me a photo of her partner's insurance card, I can performs a benefits investigation to determine the expected out of pocket cost before ordering testing.  I counseled Ms. Pinela regarding the above risks and available options. Second year [employer] genetic counseling student Jacklynn Lewis participated in portions of today's session under my supervision. The approximate face-to-face time with the genetic counselor was 30 minutes.  In summary:  Discussed carrier screening results and options for follow-up testing  Silent carrier for alpha-thalassemia  Will discuss option of partner carrier screening with her partner  Reviewed low-risk NIPS result  Reduction in risk for Down syndrome, trisomy 83, trisomy 62, sex  chromosome aneuploidies, triploidy, and 22q11.2 deletion syndrome  Offered additional testing and screening  Declined amniocentesis  Reviewed family history concerns   Gershon Crane, MS, Aeronautical engineer

## 2020-04-16 ENCOUNTER — Encounter: Payer: Self-pay | Admitting: Genetic Counselor

## 2020-04-18 ENCOUNTER — Telehealth (INDEPENDENT_AMBULATORY_CARE_PROVIDER_SITE_OTHER): Payer: Medicaid Other | Admitting: Obstetrics and Gynecology

## 2020-04-18 VITALS — BP 115/68 | HR 86 | Wt 145.9 lb

## 2020-04-18 DIAGNOSIS — Z3490 Encounter for supervision of normal pregnancy, unspecified, unspecified trimester: Secondary | ICD-10-CM

## 2020-04-18 DIAGNOSIS — Z3A22 22 weeks gestation of pregnancy: Secondary | ICD-10-CM

## 2020-04-18 NOTE — Progress Notes (Signed)
   MY CHART VIDEO VIRTUAL OBSTETRICS VISIT ENCOUNTER NOTE  I connected with Lynn Davis on 04/18/20 at  3:30 PM EST by My Chart video at home and verified that I am speaking with the correct person using two identifiers. Provider located at [employer] for [employer] at [place].   I discussed the limitations, risks, security and privacy concerns of performing an evaluation and management service by My Chart video and the availability of in person appointments. I also discussed with the patient that there may be a patient responsible charge related to this service. The patient expressed understanding and agreed to proceed.  Subjective:  Lynn Davis is a 29 y.o. G2P0010 at [redacted]w[redacted]d being followed for ongoing prenatal care.  She is currently monitored for the following issues for this low-risk pregnancy and has GBS bacteriuria and Supervision of low-risk pregnancy on their problem list.  Patient reports no complaints. Reports fetal movement. Denies any contractions, bleeding or leaking of fluid.   The following portions of the patient's history were reviewed and updated as appropriate: allergies, current medications, past family history, past medical history, past social history, past surgical history and problem list.   Objective:   General:  Alert, oriented and cooperative.   Mental Status: Normal mood and affect perceived. Normal judgment and thought content.  Rest of physical exam deferred due to type of encounter  BP 115/68   Pulse 86   Wt 145 lb 14.4 oz (66.2 kg)   LMP 10/21/2019 Comment: just spotting (neg UPT)  BMI 25.04 kg/m  **Done by patient's own at home BP cuff and scale  Assessment and Plan:  Pregnancy: G2P0010 at [redacted]w[redacted]d  1) Encounter for supervision of low-risk pregnancy, antepartum - Anticipatory guidance for next appt being on My Chart  2) [redacted] weeks gestation of pregnancy   Preterm labor symptoms and general obstetric precautions including but  not limited to vaginal bleeding, contractions, leaking of fluid and fetal movement were reviewed in detail with the patient.  I discussed the assessment and treatment plan with the patient. The patient was provided an opportunity to ask questions and all were answered. The patient agreed with the plan and demonstrated an understanding of the instructions. The patient was advised to call back or seek an in-person office evaluation/go to MAU at [facility] for any urgent or concerning symptoms. Please refer to After Visit Summary for other counseling recommendations.   I provided 5 minutes of non-face-to-face time during this encounter. There was 5 minutes of chart review time spent prior to this encounter. Total time spent = 10 minutes.  Return in about 4 weeks (around 05/16/2020) for Return OB - My Chart video.  Future Appointments  Date Time Provider Department Center  05/03/2020  1:45 PM [facility] NURSE [facility]  05/03/2020  2:00 PM [facility] US1 [facility]    Raelyn Mora, CNM Center for [employer], [facility]

## 2020-04-21 ENCOUNTER — Encounter: Payer: Self-pay | Admitting: Obstetrics and Gynecology

## 2020-05-03 ENCOUNTER — Ambulatory Visit: Payer: Medicaid Other | Attending: Obstetrics and Gynecology

## 2020-05-03 ENCOUNTER — Ambulatory Visit: Payer: Medicaid Other

## 2020-05-15 ENCOUNTER — Telehealth: Payer: Medicaid Other | Admitting: Advanced Practice Midwife

## 2020-05-16 ENCOUNTER — Telehealth: Payer: Self-pay

## 2020-05-16 NOTE — Telephone Encounter (Signed)
Called patient to reschedule Virtual appt that was on 05/15/2020. Patient did not answer and left a detailing VM to call back to reschedule.

## 2020-05-28 ENCOUNTER — Telehealth: Payer: Self-pay | Admitting: *Deleted

## 2020-05-28 NOTE — Telephone Encounter (Signed)
Patient called in regards to medical record release form from new OB office. Advised the records are being faxed and to the new OB office.   Clovis Pu, RN

## 2020-06-06 ENCOUNTER — Encounter: Payer: Medicaid Other | Admitting: Obstetrics and Gynecology

## 2020-08-18 ENCOUNTER — Inpatient Hospital Stay (HOSPITAL_COMMUNITY): Admit: 2020-08-18 | Payer: Self-pay

## 2020-11-16 ENCOUNTER — Emergency Department (HOSPITAL_COMMUNITY)
Admission: EM | Admit: 2020-11-16 | Discharge: 2020-11-16 | Disposition: A | Discharge status: Home / Self Care | Payer: Medicaid Other | Attending: Emergency Medicine | Admitting: Emergency Medicine

## 2020-11-16 ENCOUNTER — Encounter (HOSPITAL_COMMUNITY): Payer: Self-pay | Admitting: Emergency Medicine

## 2020-11-16 ENCOUNTER — Other Ambulatory Visit: Payer: Self-pay

## 2020-11-16 DIAGNOSIS — Z5321 Procedure and treatment not carried out due to patient leaving prior to being seen by health care provider: Secondary | ICD-10-CM | POA: Insufficient documentation

## 2020-11-16 DIAGNOSIS — R509 Fever, unspecified: Secondary | ICD-10-CM | POA: Diagnosis present

## 2020-11-16 DIAGNOSIS — U071 COVID-19: Secondary | ICD-10-CM | POA: Diagnosis not present

## 2020-11-16 LAB — GROUP A STREP BY PCR: Group A Strep by PCR: NOT DETECTED

## 2020-11-16 LAB — RESP PANEL BY RT-PCR (FLU A&B, COVID) ARPGX2
Influenza A by PCR: NEGATIVE
Influenza B by PCR: NEGATIVE
SARS Coronavirus 2 by RT PCR: POSITIVE — AB

## 2020-11-16 NOTE — ED Provider Notes (Signed)
Emergency Medicine Provider Triage Evaluation Note  Sharnae Winfree , a 30 y.o. female  was evaluated in triage.  Pt complains of sore throat, fever with tmax 101. Cough, body aches, fatigue for the past 2 days. Pt has a baby at home who had similar symptoms and a significant other however she did not think much of it until she became sick. She is unvaccinated for COVID. No SOB. Still tolerating secretions without difficulty.   Review of Systems  Positive: + fevers, chills, body aches, fatigue, cough, sore throat Negative: - difficulty swallowing, chest pain, SOB  Physical Exam  BP (!) 116/93 (BP Location: Left Arm)   Pulse 88   Temp 99 F (37.2 C) (Oral)   Resp 16   SpO2 100%  Gen:   Awake, no distress   Resp:  Normal effort  MSK:   Moves extremities without difficulty  Other:   Medical Decision Making  Medically screening exam initiated at 4:33 PM.  Appropriate orders placed.  Celene Squibb was informed that the remainder of the evaluation will be completed by another provider, this initial triage assessment does not replace that evaluation, and the importance of remaining in the ED until their evaluation is complete.     Tanda Rockers, PA-C 11/16/20 1634    Koleen Distance, MD 11/16/20 [phone]

## 2020-11-16 NOTE — ED Triage Notes (Signed)
Pt reports fever, cough, sore throat, chills, and body aches.

## 2020-11-16 NOTE — ED Notes (Signed)
Pt stated she was leaving AMA 

## 2021-10-07 IMAGING — US US OB < 14 WEEKS - US OB TV
1 series · 15 of 28 positions shown · non-contrast
Comparison: None.

CLINICAL DATA: Vaginal bleeding without significant pelvic pain

EXAM:
OBSTETRIC <14 WK US AND TRANSVAGINAL OB US
TECHNIQUE: Both transabdominal and transvaginal ultrasound examinations were
performed for complete evaluation of the gestation as well as the
maternal uterus, adnexal regions, and pelvic cul-de-sac.
Transvaginal technique was performed to assess early pregnancy.

[Series 1: us ob < 14 weeks - us ob tv · 15 of 48 slices shown]
[im 1/48]
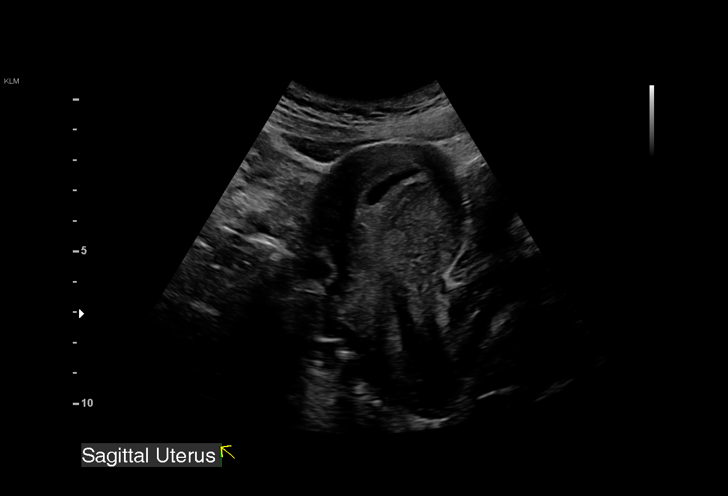
[im 4/48]
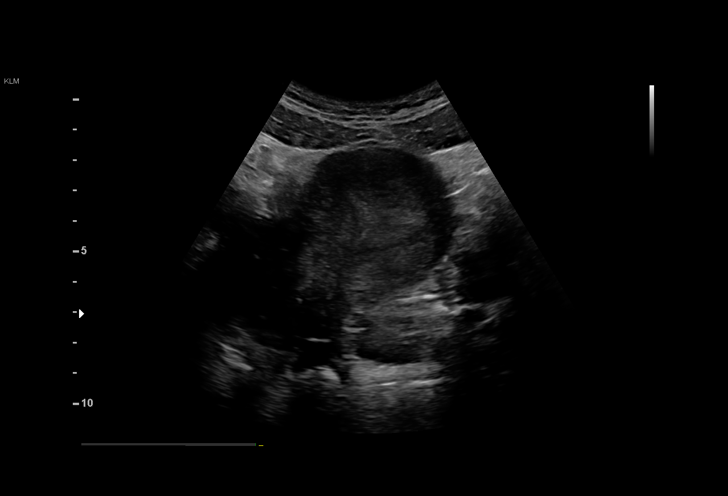
[im 7/48]
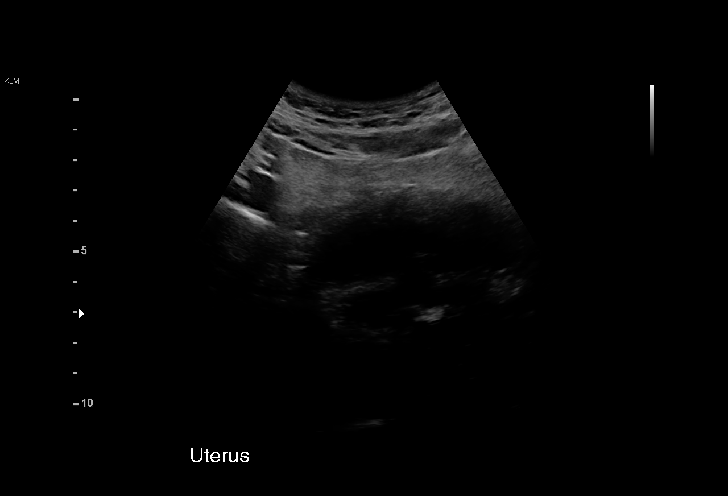
[im 11/48]
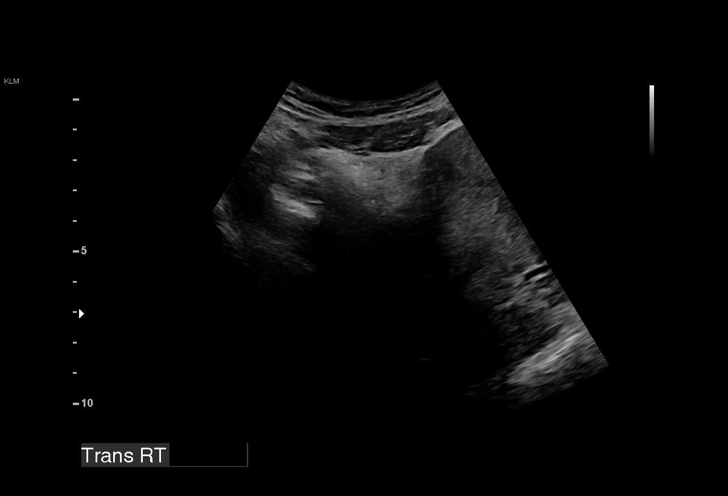
[im 14/48]
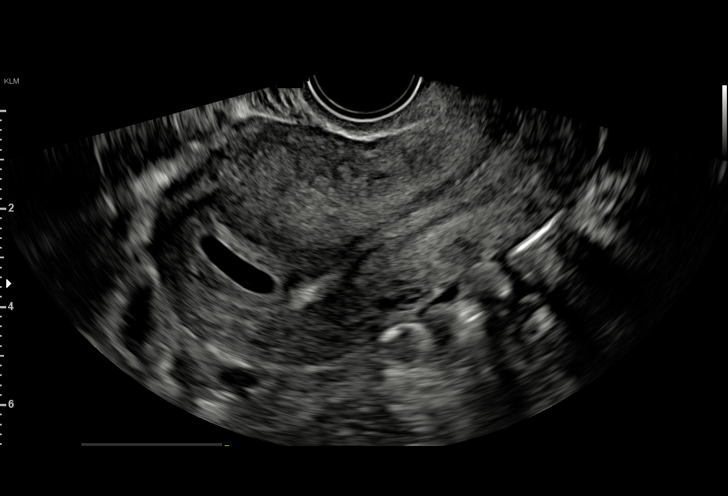
[im 18/48]
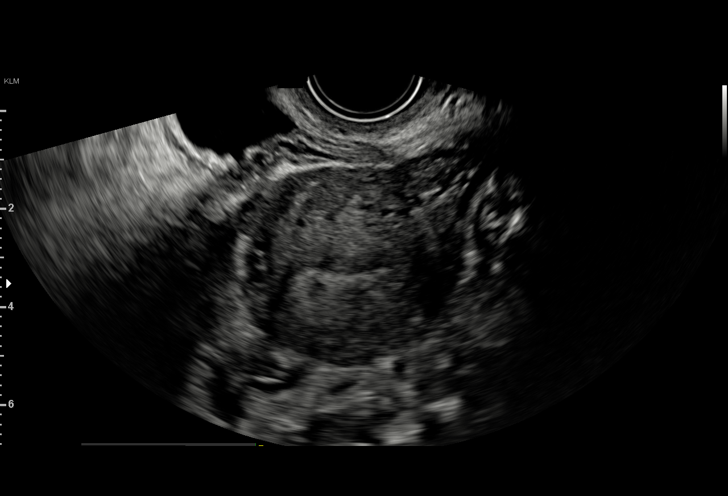
[im 21/48]
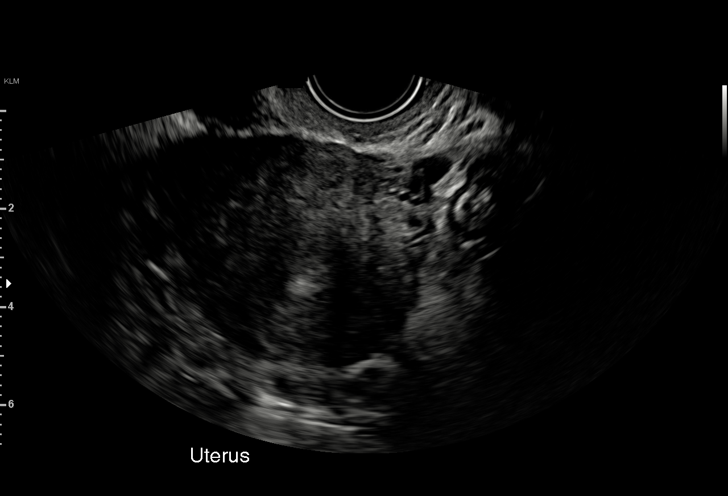
[im 25/48]
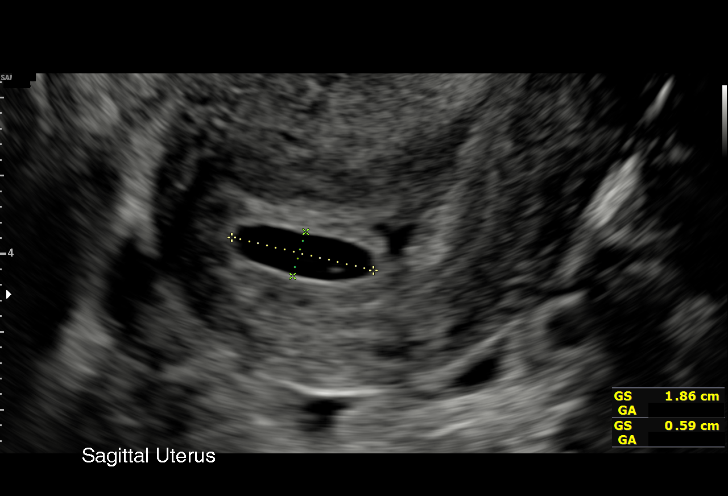
[im 27/48]
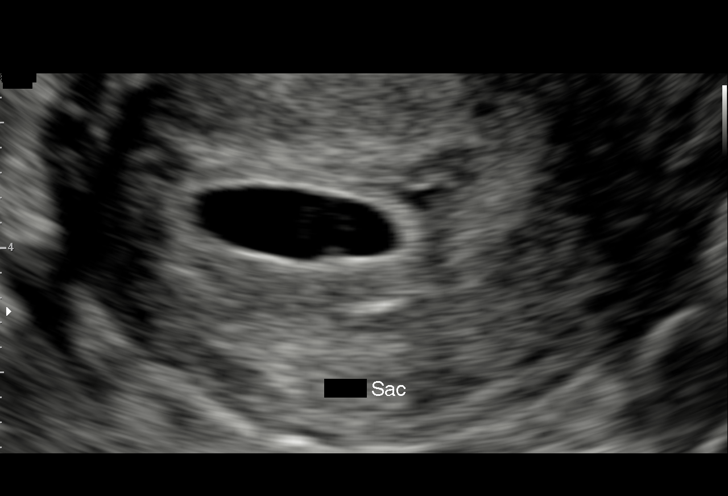
[im 30/48]
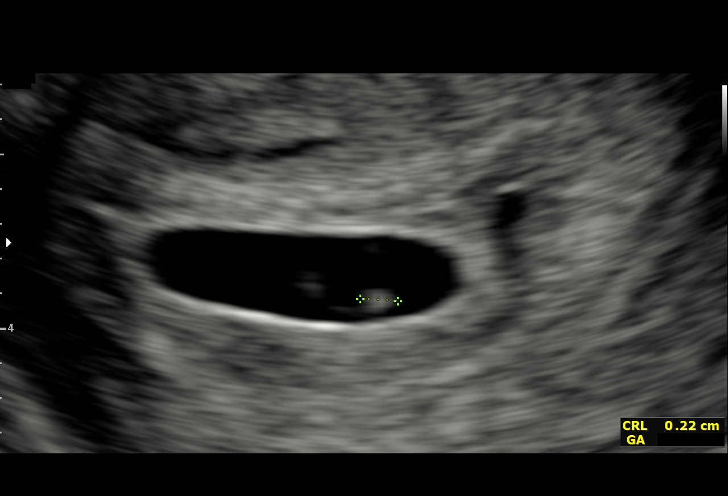
[im 34/48]
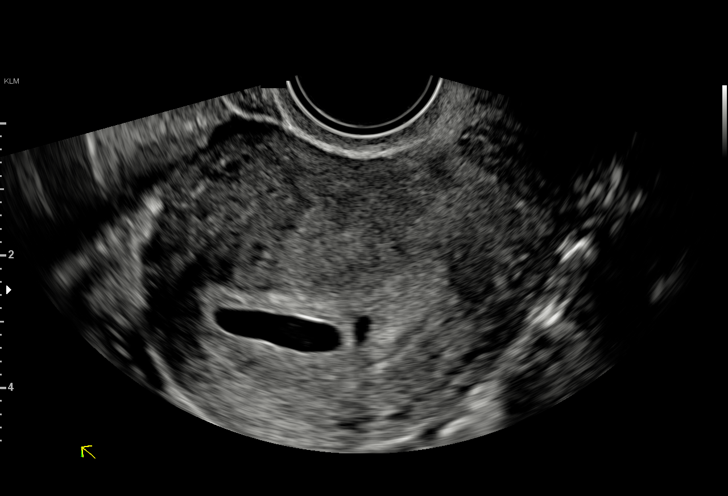
[im 37/48]
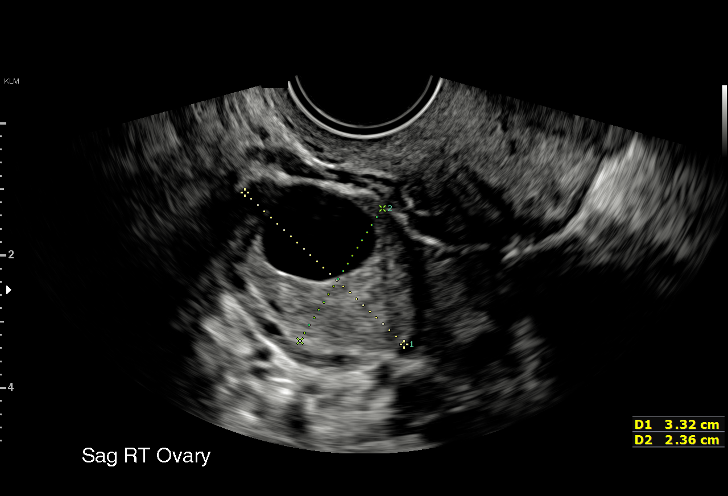
[im 41/48]
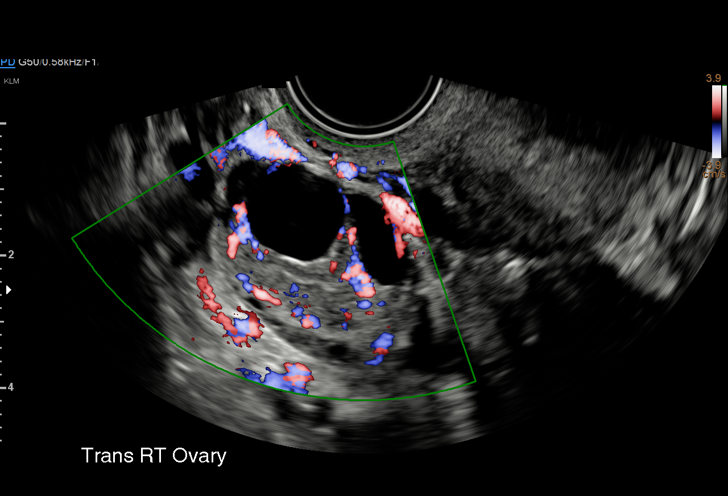
[im 44/48]
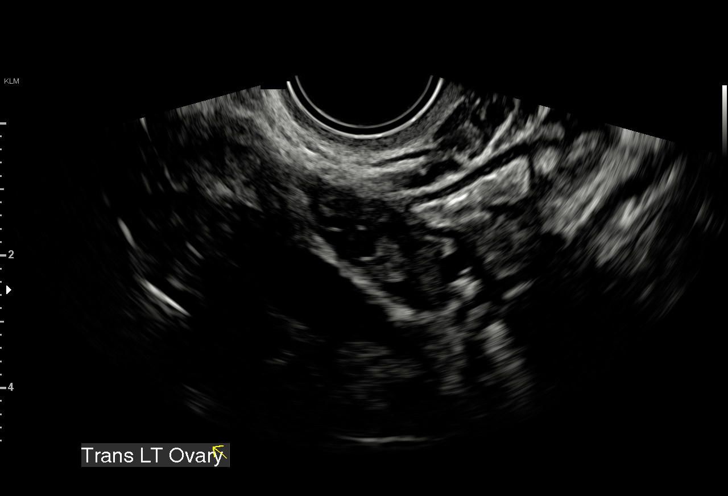
[im 48/48]
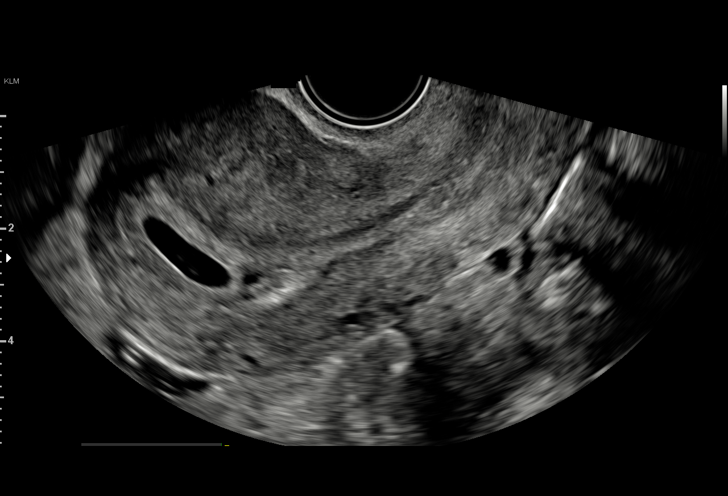

[15 of 28 positions shown; findings below may reference images not displayed]

FINDINGS: Intrauterine gestational sac: Present

Yolk sac:  Present

Embryo:  Present

Cardiac Activity: Present

Heart Rate: 124 bpm

CRL:  2.1 mm   5 w   5 d                  US EDC: 08/18/2020

Subchorionic hemorrhage:  Small

Maternal uterus/adnexae: Cystic changes are noted in the right
ovary.
IMPRESSION: Single live intrauterine gestation at 5 weeks 5 days.

Small subchorionic hemorrhage is noted.

## 2022-01-20 IMAGING — US US MFM OB DETAIL+14 WK
1 series · 13 of 28 positions shown · non-contrast
Comparison: none

[Series 1: us mfm ob detail+14 wk · 13 of 104 slices shown]
[im 4/104]
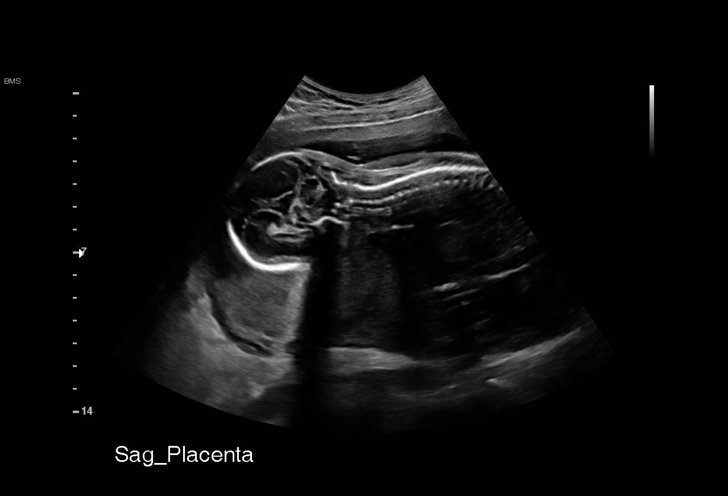
[im 12/104]
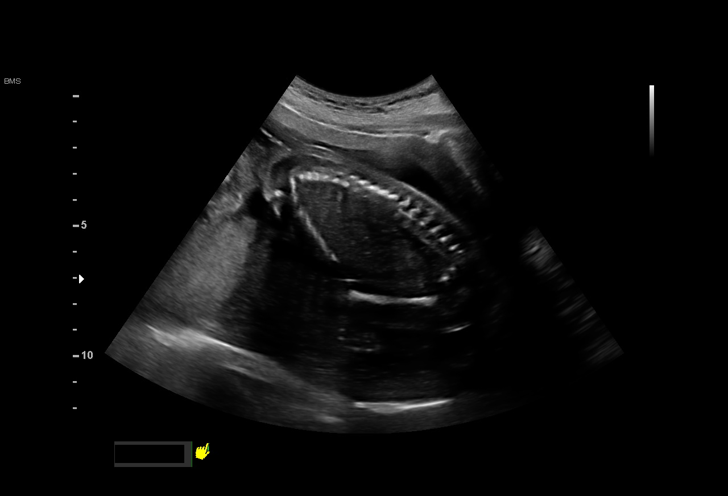
[im 20/104]
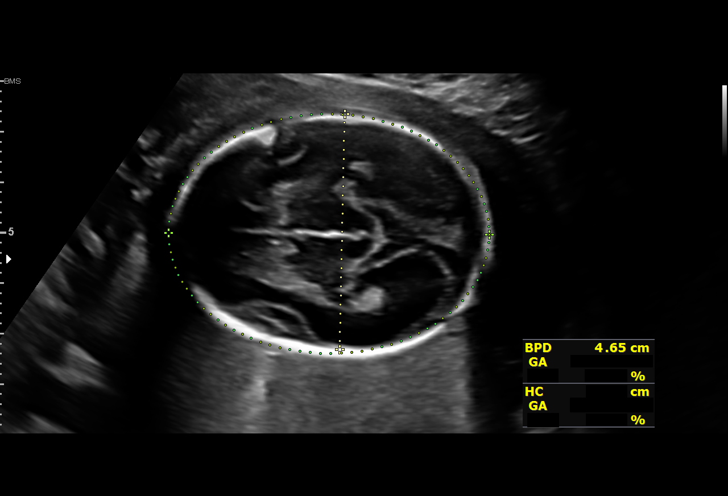
[im 27/104]
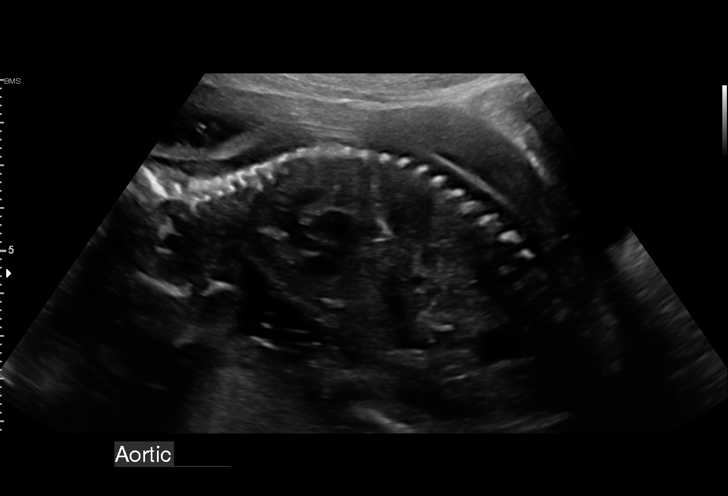
[im 35/104]
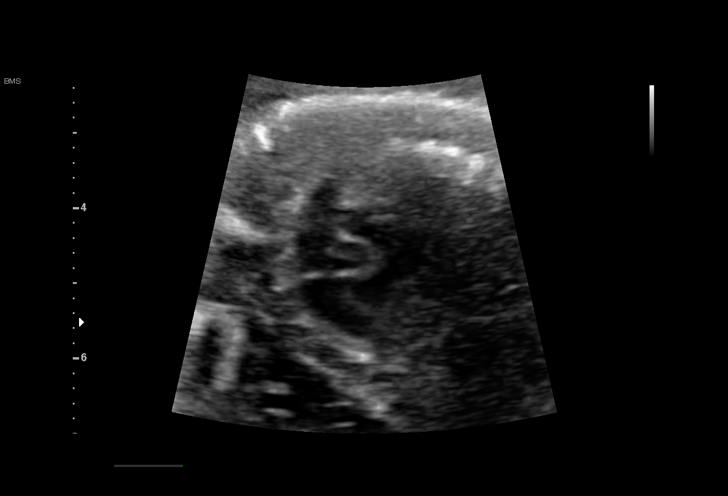
[im 42/104]
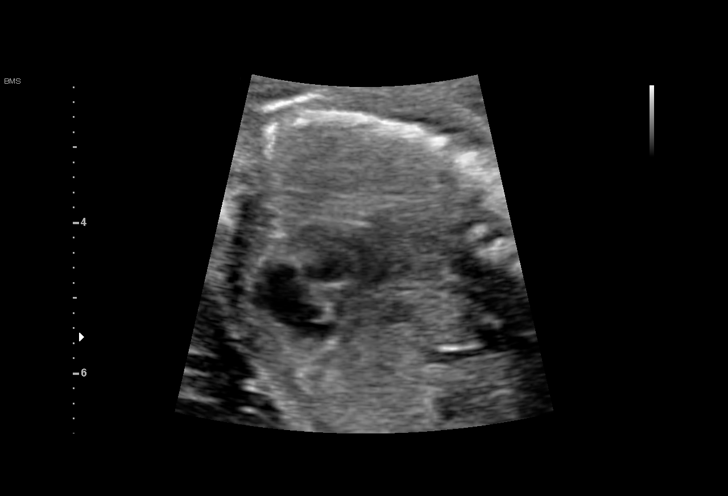
[im 54/104]
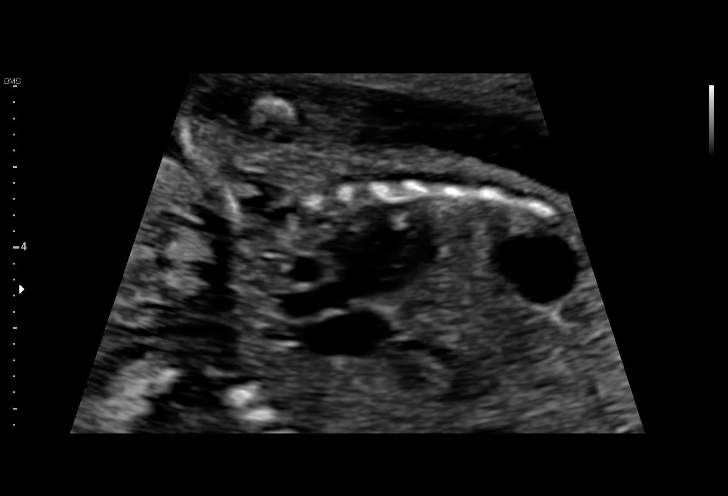
[im 62/104]
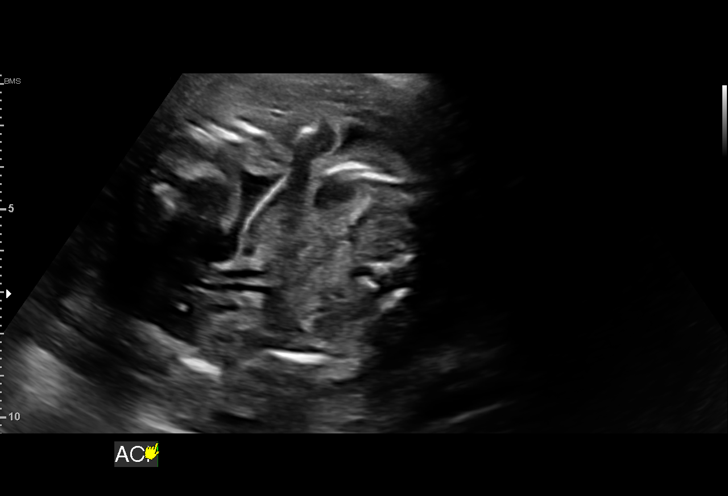
[im 69/104]
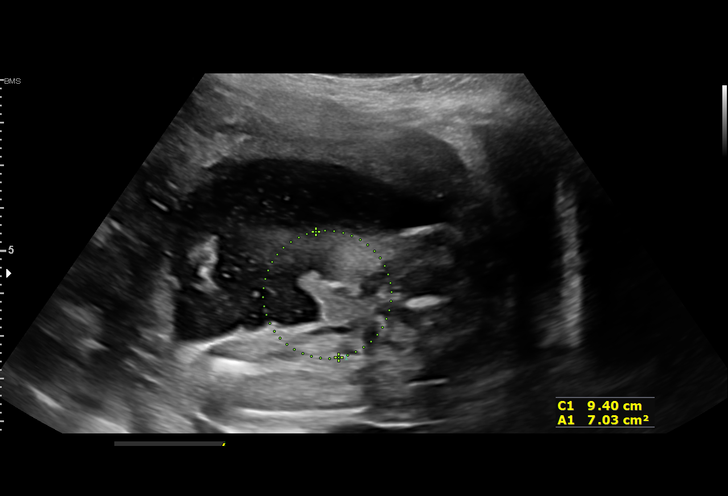
[im 77/104]
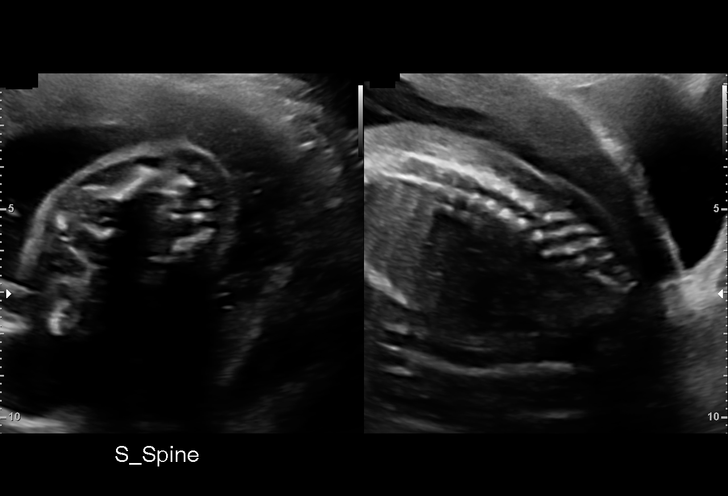
[im 84/104]
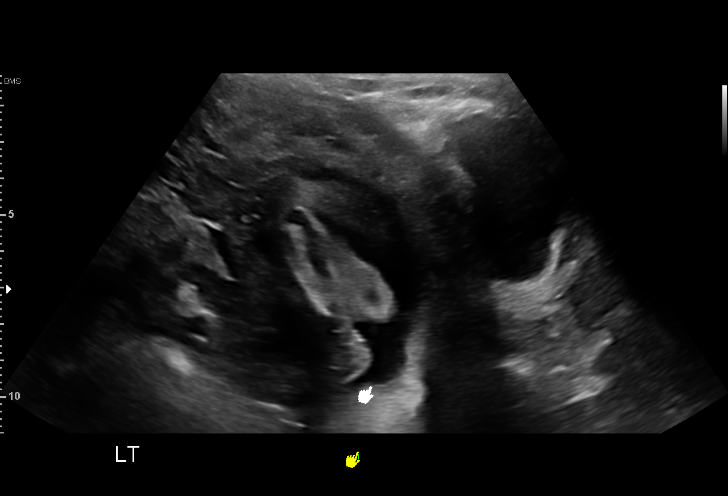
[im 92/104]
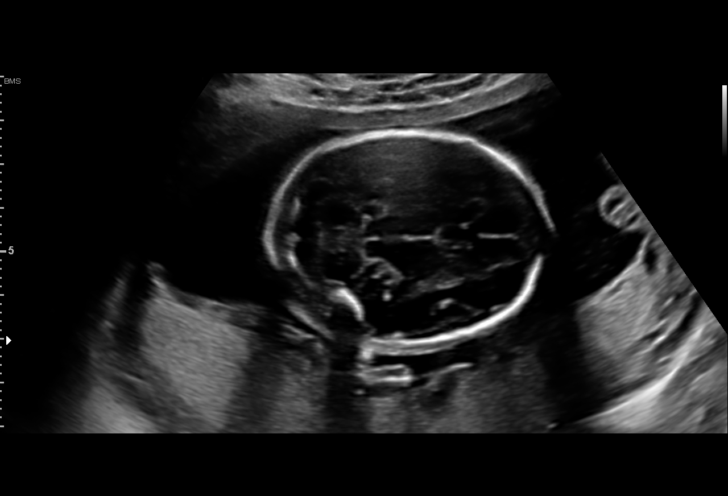
[im 100/104]
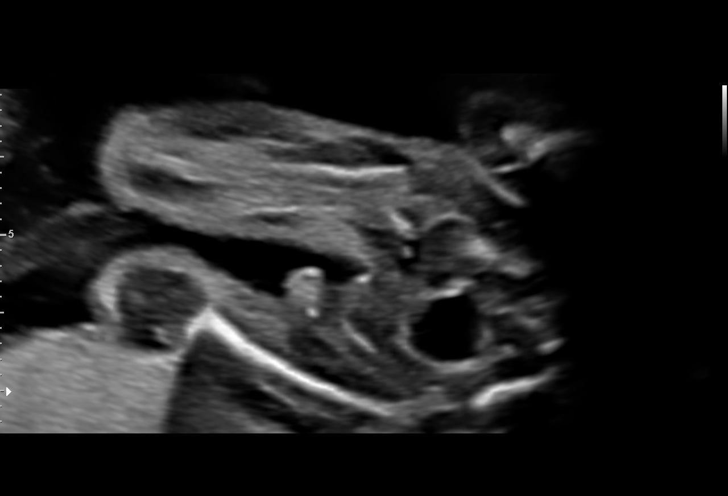

[13 of 28 positions shown; findings below may reference images not displayed]

[REDACTED]

Indications

 Hyperemesis gravidarum
 Encounter for antenatal screening for
 malformations (LR Nips) (TORRY)
 Medical complication of pregnancy (GBS)
 20 weeks gestation of pregnancy
Fetal Evaluation

 Num Of Fetuses:         1
 Fetal Heart Rate(bpm):  155
 Cardiac Activity:       Observed
 Presentation:           Breech
 Placenta:               Posterior
 P. Cord Insertion:      Visualized, central

 Amniotic Fluid
 AFI FV:      Within normal limits

                             Largest Pocket(cm)

Biometry

 BPD:      46.4  mm     G. Age:  20w 0d         22  %    CI:         71.1   %    70 - 86
                                                         FL/HC:      19.8   %    15.9 -
 HC:      175.3  mm     G. Age:  20w 0d         15  %    HC/AC:      1.11        1.06 -
 AC:      158.3  mm     G. Age:  21w 0d         52  %    FL/BPD:     74.8   %
 FL:       34.7  mm     G. Age:  21w 0d         49  %    FL/AC:      21.9   %    20 - 24
 HUM:      31.8  mm     G. Age:  20w 4d         46  %
 CER:      22.7  mm     G. Age:  21w 1d         79  %

 LV:        5.3  mm
 CM:        5.5  mm

 Est. FW:     380  gm    0 lb 13 oz      51  %
OB History

 Gravidity:    2
 Living:       0
Gestational Age

 LMP:           23w 6d        Date:  10/21/19                 EDD:   07/27/20
 U/S Today:     20w 4d                                        EDD:   08/19/20
 Best:          20w 5d     Det. By:  Previous Ultrasound      EDD:   08/18/20
Anatomy

 Cranium:               Appears normal         LVOT:                   Appears normal
 Cavum:                 Appears normal         Aortic Arch:            Appears normal
 Ventricles:            Appears normal         Ductal Arch:            Not well visualized
 Choroid Plexus:        Appears normal         Diaphragm:              Appears normal
 Cerebellum:            Appears normal         Stomach:                Appears normal, left
                                                                       sided
 Posterior Fossa:       Appears normal         Abdomen:                Appears normal
 Nuchal Fold:           Not applicable (>20    Abdominal Wall:         Appears nml (cord
                        wks GA)                                        insert, abd wall)
 Face:                  Orbits nl; profile not Cord Vessels:           Appears normal (3
                        well visualized                                vessel cord)
 Lips:                  Appears normal         Kidneys:                Appear normal
 Palate:                Not well visualized    Bladder:                Appears normal
 Thoracic:              Appears normal         Spine:                  Appears normal
 Heart:                 Not well visualized,   Upper Extremities:      Appears normal
                        EIF
 RVOT:                  Appears normal         Lower Extremities:      Appears normal

 Other:  Hands and feet visualized. Open hands visualized. Fetus appears to
         be a male. Technically difficult due to fetal position.
Cervix Uterus Adnexa

 Cervix
 Length:           4.22  cm.
 Normal appearance by transabdominal scan.

 Uterus
 No abnormality visualized.

 Right Ovary
 Not visualized.

 Left Ovary
 Not visualized.
 Cul De Sac
 No free fluid seen.

 Adnexa
 No adnexal mass visualized.
Comments

 This patient was seen for a detailed fetal anatomy scan.
 She denies any significant past medical history and denies
 any problems in her current pregnancy.
 She had a cell free DNA test earlier in her pregnancy which
 indicated a low risk for trisomy 21, 18, and 13.
 She was informed that the fetal growth and amniotic fluid
 level were appropriate for her gestational age.
 On today's exam, an intracardiac echogenic focus was noted
 in the left ventricle of the fetal heart.  The small association
 between an echogenic focus and Down syndrome was
 discussed. Due to the echogenic focus noted today, the
 patient was offered and declined an amniocentesis today for
 definitive diagnosis of fetal aneuploidy.  She reports that she
 is comfortable with her negative cell free DNA test.
 The views of the fetal anatomy were limited today due to the
 fetal position.
 The patient was informed that anomalies may be missed due
 to technical limitations. If the fetus is in a suboptimal position
 or maternal habitus is increased, visualization of the fetus in
 the maternal uterus may be impaired.
 A follow-up exam was scheduled in 4 weeks to complete the
 views of the fetal anatomy.

## 2022-06-02 ENCOUNTER — Ambulatory Visit (INDEPENDENT_AMBULATORY_CARE_PROVIDER_SITE_OTHER): Payer: Medicaid Other | Admitting: *Deleted

## 2022-06-02 DIAGNOSIS — Z3202 Encounter for pregnancy test, result negative: Secondary | ICD-10-CM | POA: Diagnosis not present

## 2022-06-02 DIAGNOSIS — Z3200 Encounter for pregnancy test, result unknown: Secondary | ICD-10-CM

## 2022-06-02 LAB — POCT PREGNANCY, URINE: Preg Test, Ur: NEGATIVE

## 2022-06-02 NOTE — Progress Notes (Signed)
Patient dropped off urine for pregnancy test which was negative. I called and informed her of results. She reports she missed her period , last period was in November around Thanksgiving. She reports her periods have been regular since she had her child. I advised her to have a repeat pregnancy test in a week if she still has not had a period and to wait for results so that if urine pregnancy test is negative, we can do a blood pregnancy test. Then if blood negative she can follow up with provider if desired. She has already scheduled annual exam in February. She voices understanding and agreement with plan of care. Staci Acosta

## 2022-06-10 ENCOUNTER — Ambulatory Visit (INDEPENDENT_AMBULATORY_CARE_PROVIDER_SITE_OTHER): Payer: Medicaid Other

## 2022-06-10 ENCOUNTER — Other Ambulatory Visit: Payer: Self-pay | Admitting: Obstetrics and Gynecology

## 2022-06-10 ENCOUNTER — Other Ambulatory Visit: Payer: Self-pay

## 2022-06-10 VITALS — BP 123/77 | HR 70

## 2022-06-10 DIAGNOSIS — N912 Amenorrhea, unspecified: Secondary | ICD-10-CM | POA: Diagnosis not present

## 2022-06-10 DIAGNOSIS — Z3202 Encounter for pregnancy test, result negative: Secondary | ICD-10-CM

## 2022-06-10 DIAGNOSIS — Z3200 Encounter for pregnancy test, result unknown: Secondary | ICD-10-CM

## 2022-06-10 LAB — POCT PREGNANCY, URINE: Preg Test, Ur: NEGATIVE

## 2022-06-10 NOTE — Progress Notes (Signed)
Here today for follow up from negative UPT on 06/03/22. UPT today is negative. LMP mid November.  No hormonal birth control in 3 + years. Pt states she stopped breastfeeding 1.5 years prior. Has had regular, monthly period since cessation of breast feeding. Pt to see Currie Paris, MD. Reviewed history with provider. Lab orders placed by A[facility] and drawn by lab. [place] to be scheduled and communicated to patient via [place].   Apolonio Schneiders RN 06/10/22

## 2022-06-10 NOTE — Addendum Note (Signed)
Addended by: Madalyn Rob D on: 06/10/2022 04:18 PM   Modules accepted: Orders

## 2022-06-11 LAB — FOLLICLE STIMULATING HORMONE: FSH: 4 m[IU]/mL

## 2022-06-11 LAB — TSH RFX ON ABNORMAL TO FREE T4: TSH: 0.925 u[IU]/mL (ref 0.450–4.500)

## 2022-06-11 LAB — ESTRADIOL: Estradiol: 72.6 pg/mL

## 2022-06-11 LAB — HEMOGLOBIN A1C
Est. average glucose Bld gHb Est-mCnc: 108 mg/dL
Hgb A1c MFr Bld: 5.4 % (ref 4.8–5.6)

## 2022-06-11 LAB — PROLACTIN: Prolactin: 11.2 ng/mL (ref 4.8–33.4)

## 2022-06-18 ENCOUNTER — Other Ambulatory Visit: Payer: Self-pay

## 2022-06-18 ENCOUNTER — Ambulatory Visit (INDEPENDENT_AMBULATORY_CARE_PROVIDER_SITE_OTHER): Payer: Medicaid Other | Admitting: Obstetrics and Gynecology

## 2022-06-18 ENCOUNTER — Encounter: Payer: Self-pay | Admitting: Obstetrics and Gynecology

## 2022-06-18 VITALS — BP 111/85 | HR 116

## 2022-06-18 DIAGNOSIS — N912 Amenorrhea, unspecified: Secondary | ICD-10-CM | POA: Diagnosis not present

## 2022-06-18 MED ORDER — MEDROXYPROGESTERONE ACETATE 10 MG PO TABS: 10.0000 mg | ORAL_TABLET | Freq: Every day | ORAL | 0 refills | Status: AC

## 2022-06-18 NOTE — Progress Notes (Signed)
NEW GYNECOLOGY PATIENT Patient name: Lynn Davis MRN [id number]  Date of birth: 02/05/1991 Chief Complaint:   Follow-up     History:  Lynn Davis is a 32 y.o. G2P1011 being seen today for amenorrhea.  Feeling very normal.   Increased stress recently.  No breast or nipple discharge or change. No weight changes. No new medoications or dietrary changes. No birth control for 3 years. No new diagnosis. Not currently breastfeeding, stopped when son was 7 months. No signs or symptoms associtted with cycle. Typically 20th or 22nd of each month. No changes in the cycle in the last month. Not sur ewhen she was ovulating but hasn't noticed anything different.   Previously had mirena x1.5 years - just to prevent pregnancy. Had a SAB prior to IUD.   Was irregular after IUD in 2016. Got pregnant in 2021 - had not been trying to get pregnant.   No visual changes and no HA. No bowel or urinary changes. Intermittent, sharp lower abdominal pain, not like typical menstrual cramp. No trigger for pain. Nothing makes it better or worse.       Gynecologic History No LMP recorded. Contraception: none Last Pap: 01/2020 NILM  Obstetric History OB History  Gravida Para Term Preterm AB Living  2 1 1   1 1   SAB IAB Ectopic Multiple Live Births    1     1    # Outcome Date GA Lbr Len/2nd Weight Sex Delivery Anes PTL Lv  2 Term 10/09/20 [redacted]w[redacted]d   M Vag-Spont   LIV  1 IAB 2012            Past Medical History:  Diagnosis Date   Medical history non-contributory     Past Surgical History:  Procedure Laterality Date   NO PAST SURGERIES      No current outpatient medications on file prior to visit.   No current facility-administered medications on file prior to visit.    No Known Allergies  Social History:  reports that she has been smoking cigarettes. She has never used smokeless tobacco. She reports that she does not currently use alcohol. She reports that she does not  currently use drugs after having used the following drugs: Marijuana. Frequency: 7.00 times per week.  Family History  Problem Relation Age of Onset   Hyperlipidemia Mother    Emphysema Father     The following portions of the patient's history were reviewed and updated as appropriate: allergies, current medications, past family history, past medical history, past social history, past surgical history and problem list.  Review of Systems Pertinent items noted in HPI and remainder of comprehensive ROS otherwise negative.  Physical Exam:  BP 111/85   Pulse (!) 116  Physical Exam Vitals and nursing note reviewed.  Constitutional:      Appearance: Normal appearance.  Cardiovascular:     Rate and Rhythm: Normal rate.  Pulmonary:     Effort: Pulmonary effort is normal.     Breath sounds: Normal breath sounds.  Abdominal:     Comments: Soft, mild lower abdominal tenderness without rebound or guarding  Neurological:     General: No focal deficit present.     Mental Status: She is alert and oriented to person, place, and time.  Psychiatric:        Mood and Affect: Mood normal.        Behavior: Behavior normal.        Thought Content: Thought content normal.  Judgment: Judgment normal.      Assessment and Plan:   1. Amenorrhea Will follow-up ultrasound results.  Unclear etiology of amenorrhea.  Will do Provera challenge to see if menses elicited . - medroxyPROGESTERone (PROVERA) 10 MG tablet; Take 1 tablet (10 mg total) by mouth daily.  Dispense: 10 tablet; Refill: 0    Routine preventative health maintenance measures emphasized. Please refer to After Visit Summary for other counseling recommendations.   Follow-up: No follow-ups on file.      Darliss Cheney, MD Obstetrician & Gynecologist, [facility] [employer], [place]

## 2022-06-19 ENCOUNTER — Ambulatory Visit (HOSPITAL_COMMUNITY): Payer: Medicaid Other

## 2022-07-03 ENCOUNTER — Encounter (HOSPITAL_COMMUNITY): Payer: Self-pay

## 2022-07-03 ENCOUNTER — Ambulatory Visit (HOSPITAL_COMMUNITY): Admission: RE | Admit: 2022-07-03 | Payer: Medicaid Other | Source: Ambulatory Visit

## 2023-11-05 ENCOUNTER — Encounter (HOSPITAL_COMMUNITY): Payer: Self-pay

## 2023-11-05 ENCOUNTER — Emergency Department (HOSPITAL_COMMUNITY)
Admission: EM | Admit: 2023-11-05 | Discharge: 2023-11-05 | Disposition: A | Discharge status: Home / Self Care | Attending: Emergency Medicine | Admitting: Emergency Medicine

## 2023-11-05 ENCOUNTER — Other Ambulatory Visit: Payer: Self-pay

## 2023-11-05 DIAGNOSIS — H6092 Unspecified otitis externa, left ear: Secondary | ICD-10-CM | POA: Insufficient documentation

## 2023-11-05 DIAGNOSIS — H9202 Otalgia, left ear: Secondary | ICD-10-CM | POA: Diagnosis present

## 2023-11-05 MED ORDER — IBUPROFEN 400 MG PO TABS
600.0000 mg | ORAL_TABLET | Freq: Once | ORAL | Status: AC
  Administered 2023-11-05: 600 mg via ORAL
  Filled 2023-11-05: qty 1

## 2023-11-05 MED ORDER — CIPROFLOXACIN-DEXAMETHASONE 0.3-0.1 % OT SUSP: 4.0000 [drp] | Freq: Two times a day (BID) | OTIC | 0 refills | Status: AC

## 2023-11-05 NOTE — ED Triage Notes (Signed)
 Left ear pain x 1.5 weeks. Tried some peroxide in the ear but had no improvement.

## 2023-11-05 NOTE — ED Provider Notes (Signed)
  [place] [facility] AT [facility] Provider Note   CSN: [id number] Arrival date & time: 11/05/23  0602     Patient presents with: Ear Pain  HPI Lynn Davis is a 33 y.o. female presenting for left ear otalgia.  Started a week and a half ago.  She also notes some clear discharge from the ear as well.  Has tried peroxide at home with minimal relief.  Denies tinnitus or hearing loss.  Denies tenderness behind the ears.  Denies cough, nasal congestion and fever.  Took Tylenol this morning for pain.   HPI     Prior to Admission medications   Medication Sig Start Date End Date Taking? Authorizing Provider  ciprofloxacin -dexamethasone  (CIPRODEX ) OTIC suspension Place 4 drops into the left ear 2 (two) times daily for 7 days. 11/05/23 11/12/23 Yes Spence Soberano K, PA-C  medroxyPROGESTERone  (PROVERA ) 10 MG tablet Take 1 tablet (10 mg total) by mouth daily. 06/18/22   Ajewole, Christana, MD    Allergies: Patient has no known allergies.    Review of Systems See HPI  Updated Vital Signs BP 109/74 (BP Location: Right Arm)   Pulse 63   Temp 98.4 F (36.9 C)   Resp 16   SpO2 99%   Physical Exam Constitutional:      Appearance: Normal appearance.  HENT:     Head: Normocephalic.     Right Ear: Hearing, tympanic membrane, ear canal and external ear normal. No mastoid tenderness.     Left Ear: No mastoid tenderness.     Ears:     Comments: Ear canal is erythematous and edematous with notable serous discharge.  Cannot visualize the TM due to the inflammation.    Nose: Nose normal.   Eyes:     Conjunctiva/sclera: Conjunctivae normal.   Pulmonary:     Effort: Pulmonary effort is normal.   Neurological:     Mental Status: She is alert.   Psychiatric:        Mood and Affect: Mood normal.     (all labs ordered are listed, but only abnormal results are displayed) Labs Reviewed - No data to display  EKG: None  Radiology: No results  found.   Procedures   Medications Ordered in the ED  ibuprofen  (ADVIL ) tablet 600 mg (has no administration in time range)                                    Medical Decision Making  33 year old well-appearing female presenting for left ear pain.  Exam findings are consistent with otitis externa.  Otherwise she looks well, hemodynamically stable, vitals are normal.  Feel she is safe for discharge and outpatient antibiotic therapy as appropriate.  Sent Ciprodex  to her pharmacy.  Advised supportive treatment at home as well.  Advised if symptoms persist to follow-up with ENT.  Discussed return precautions.  Discharged in good condition.     Final diagnoses:  Otitis externa of left ear, unspecified chronicity, unspecified type    ED Discharge Orders          Ordered    ciprofloxacin -dexamethasone  (CIPRODEX ) OTIC suspension  2 times daily        11/05/23 0801               Lang Norleen POUR, PA-C 11/05/23 0804    Elnor Jayson LABOR, DO 11/06/23 1512

## 2023-11-05 NOTE — Discharge Instructions (Addendum)
 Evaluation today revealed that you likely have an external ear infection in your left ear.  I sent topical antibiotic drops that also include a steroid to your pharmacy.  If your symptoms persist please follow-up with ENT.  If you have any tenderness behind your ears, develop a fever, severe headache, neck stiffness or any other concerning symptom please return to the ED for further evaluation.
